# Patient Record
Sex: Male | Born: 1940 | Race: Black or African American | Hispanic: No | Marital: Married | Smoking: Current every day smoker
Health system: Southern US, Community
[De-identification: ages and names within clinical notes are randomized; demographics above are authoritative.]

## PROBLEM LIST (undated history)

## (undated) DIAGNOSIS — Z860100 Personal history of colon polyps, unspecified: Secondary | ICD-10-CM

## (undated) DIAGNOSIS — D696 Thrombocytopenia, unspecified: Secondary | ICD-10-CM

## (undated) DIAGNOSIS — M549 Dorsalgia, unspecified: Secondary | ICD-10-CM

## (undated) DIAGNOSIS — I471 Supraventricular tachycardia, unspecified: Secondary | ICD-10-CM

## (undated) DIAGNOSIS — I4729 Other ventricular tachycardia: Secondary | ICD-10-CM

## (undated) DIAGNOSIS — N183 Chronic kidney disease, stage 3 unspecified: Secondary | ICD-10-CM

## (undated) DIAGNOSIS — I452 Bifascicular block: Secondary | ICD-10-CM

## (undated) DIAGNOSIS — E782 Mixed hyperlipidemia: Secondary | ICD-10-CM

## (undated) DIAGNOSIS — I5189 Other ill-defined heart diseases: Secondary | ICD-10-CM

## (undated) DIAGNOSIS — R001 Bradycardia, unspecified: Secondary | ICD-10-CM

## (undated) DIAGNOSIS — L989 Disorder of the skin and subcutaneous tissue, unspecified: Secondary | ICD-10-CM

## (undated) DIAGNOSIS — Z8619 Personal history of other infectious and parasitic diseases: Secondary | ICD-10-CM

## (undated) DIAGNOSIS — I272 Pulmonary hypertension, unspecified: Secondary | ICD-10-CM

## (undated) DIAGNOSIS — M1990 Unspecified osteoarthritis, unspecified site: Secondary | ICD-10-CM

## (undated) HISTORY — DX: Other ill-defined heart diseases: I51.89

## (undated) HISTORY — DX: Personal history of colon polyps, unspecified: Z86.0100

## (undated) HISTORY — DX: Chronic kidney disease, stage 3 unspecified: N18.30

## (undated) HISTORY — DX: Other ventricular tachycardia: I47.29

## (undated) HISTORY — DX: Bradycardia, unspecified: R00.1

## (undated) HISTORY — DX: Unspecified osteoarthritis, unspecified site: M19.90

## (undated) HISTORY — DX: Disorder of the skin and subcutaneous tissue, unspecified: L98.9

## (undated) HISTORY — DX: Personal history of other infectious and parasitic diseases: Z86.19

## (undated) HISTORY — PX: COLONOSCOPY: SHX174

## (undated) HISTORY — DX: Supraventricular tachycardia, unspecified: I47.10

## (undated) HISTORY — DX: Mixed hyperlipidemia: E78.2

## (undated) HISTORY — DX: Bifascicular block: I45.2

## (undated) HISTORY — DX: Personal history of colonic polyps: Z86.010

## (undated) HISTORY — PX: HEMORRHOID SURGERY: SHX153

## (undated) HISTORY — PX: HERNIA REPAIR: SHX51

## (undated) HISTORY — DX: Pulmonary hypertension, unspecified: I27.20

---

## 2005-11-27 ENCOUNTER — Ambulatory Visit: Payer: Self-pay

## 2007-02-22 ENCOUNTER — Ambulatory Visit: Payer: Self-pay | Admitting: Podiatry

## 2007-09-27 ENCOUNTER — Ambulatory Visit: Payer: Self-pay | Admitting: Unknown Physician Specialty

## 2009-05-24 ENCOUNTER — Ambulatory Visit: Payer: Self-pay | Admitting: Internal Medicine

## 2011-10-30 LAB — CBC AND DIFFERENTIAL
HCT: 42 % (ref 41–53)
Platelets: 133 10*3/uL — AB (ref 150–399)

## 2011-11-16 ENCOUNTER — Ambulatory Visit: Payer: Self-pay | Admitting: Internal Medicine

## 2012-08-05 ENCOUNTER — Ambulatory Visit (INDEPENDENT_AMBULATORY_CARE_PROVIDER_SITE_OTHER): Payer: Medicare Other | Admitting: Internal Medicine

## 2012-08-05 ENCOUNTER — Encounter: Payer: Self-pay | Admitting: *Deleted

## 2012-08-05 ENCOUNTER — Encounter: Payer: Self-pay | Admitting: Internal Medicine

## 2012-08-05 VITALS — BP 116/72 | HR 65 | Temp 97.5°F | Ht 74.0 in | Wt 187.0 lb

## 2012-08-05 DIAGNOSIS — Z23 Encounter for immunization: Secondary | ICD-10-CM

## 2012-08-05 DIAGNOSIS — Z139 Encounter for screening, unspecified: Secondary | ICD-10-CM

## 2012-08-05 DIAGNOSIS — E559 Vitamin D deficiency, unspecified: Secondary | ICD-10-CM

## 2012-08-05 DIAGNOSIS — R5381 Other malaise: Secondary | ICD-10-CM

## 2012-08-05 DIAGNOSIS — D696 Thrombocytopenia, unspecified: Secondary | ICD-10-CM

## 2012-08-05 DIAGNOSIS — R5383 Other fatigue: Secondary | ICD-10-CM

## 2012-08-05 MED ORDER — PNEUMOCOCCAL VAC POLYVALENT 25 MCG/0.5ML IJ INJ
0.5000 mL | INJECTION | Freq: Once | INTRAMUSCULAR | Status: AC
  Administered 2012-08-05: 0.5 mL via INTRAMUSCULAR

## 2012-08-06 ENCOUNTER — Encounter: Payer: Self-pay | Admitting: Internal Medicine
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
## 2012-08-06 NOTE — Assessment & Plan Note (Signed)
Continue supplements.  Check vit D level.

## 2012-08-06 NOTE — Assessment & Plan Note (Signed)
Check cbc 

## 2012-11-12 ENCOUNTER — Encounter: Payer: Self-pay | Admitting: Internal Medicine

## 2012-11-12 ENCOUNTER — Ambulatory Visit (INDEPENDENT_AMBULATORY_CARE_PROVIDER_SITE_OTHER): Payer: Medicare Other | Admitting: Internal Medicine

## 2012-11-12 VITALS — BP 120/80 | HR 51 | Temp 97.7°F | Ht 71.5 in | Wt 172.5 lb

## 2012-11-12 DIAGNOSIS — R5381 Other malaise: Secondary | ICD-10-CM

## 2012-11-12 DIAGNOSIS — Z125 Encounter for screening for malignant neoplasm of prostate: Secondary | ICD-10-CM

## 2012-11-12 DIAGNOSIS — Z1211 Encounter for screening for malignant neoplasm of colon: Secondary | ICD-10-CM

## 2012-11-12 DIAGNOSIS — D696 Thrombocytopenia, unspecified: Secondary | ICD-10-CM

## 2012-11-12 DIAGNOSIS — E559 Vitamin D deficiency, unspecified: Secondary | ICD-10-CM

## 2012-11-12 DIAGNOSIS — R5383 Other fatigue: Secondary | ICD-10-CM

## 2012-11-12 DIAGNOSIS — Z136 Encounter for screening for cardiovascular disorders: Secondary | ICD-10-CM

## 2012-11-12 DIAGNOSIS — Z139 Encounter for screening, unspecified: Secondary | ICD-10-CM

## 2012-11-12 LAB — CBC WITH DIFFERENTIAL/PLATELET
Basophils Absolute: 0 10*3/uL (ref 0.0–0.1)
Eosinophils Absolute: 0.2 10*3/uL (ref 0.0–0.7)
Lymphocytes Relative: 38.9 % (ref 12.0–46.0)
MCHC: 35.3 g/dL (ref 30.0–36.0)
Neutro Abs: 2.5 10*3/uL (ref 1.4–7.7)
Neutrophils Relative %: 49.3 % (ref 43.0–77.0)
RDW: 14.5 % (ref 11.5–14.6)

## 2012-11-12 LAB — COMPREHENSIVE METABOLIC PANEL
ALT: 16 U/L (ref 0–53)
AST: 21 U/L (ref 0–37)
Calcium: 9.5 mg/dL (ref 8.4–10.5)
Chloride: 107 mEq/L (ref 96–112)
Creatinine, Ser: 1.2 mg/dL (ref 0.4–1.5)
Potassium: 4.8 mEq/L (ref 3.5–5.1)

## 2012-11-12 LAB — LIPID PANEL
LDL Cholesterol: 103 mg/dL — ABNORMAL HIGH (ref 0–99)
Total CHOL/HDL Ratio: 5
Triglycerides: 103 mg/dL (ref 0.0–149.0)

## 2012-11-12 NOTE — Assessment & Plan Note (Signed)
Check cbc 
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
## 2012-11-12 NOTE — Assessment & Plan Note (Signed)
Continue supplements.  Check vit D level.

## 2012-11-13 ENCOUNTER — Telehealth: Payer: Self-pay | Admitting: Internal Medicine

## 2012-11-13 ENCOUNTER — Other Ambulatory Visit: Payer: Self-pay | Admitting: Internal Medicine

## 2012-11-13 DIAGNOSIS — E559 Vitamin D deficiency, unspecified: Secondary | ICD-10-CM

## 2012-11-13 DIAGNOSIS — D696 Thrombocytopenia, unspecified: Secondary | ICD-10-CM

## 2012-11-13 MED ORDER — ERGOCALCIFEROL 1.25 MG (50000 UT) PO CAPS: 50000.0000 [IU] | ORAL_CAPSULE | ORAL | Status: DC

## 2012-11-13 NOTE — Telephone Encounter (Signed)
Pt notified of lab results.  Will start ergocalciferol 50,000 units q week. Will recheck vitamin D and platelet count 02/06/13.

## 2012-11-13 NOTE — Progress Notes (Signed)
rx sent in for ergocalciferol.   

## 2012-12-18 ENCOUNTER — Encounter: Payer: Self-pay | Admitting: Internal Medicine

## 2013-01-24 ENCOUNTER — Ambulatory Visit: Payer: Self-pay | Admitting: Unknown Physician Specialty

## 2013-01-31 ENCOUNTER — Encounter: Payer: Self-pay | Admitting: Internal Medicine

## 2013-01-31 DIAGNOSIS — Z860100 Personal history of colon polyps, unspecified: Secondary | ICD-10-CM | POA: Insufficient documentation

## 2013-02-05 ENCOUNTER — Other Ambulatory Visit: Payer: Medicare Other

## 2013-02-05 DIAGNOSIS — D696 Thrombocytopenia, unspecified: Secondary | ICD-10-CM

## 2013-02-05 DIAGNOSIS — E559 Vitamin D deficiency, unspecified: Secondary | ICD-10-CM

## 2013-02-06 ENCOUNTER — Other Ambulatory Visit: Payer: Medicare Other

## 2013-02-06 LAB — PLATELET COUNT: Platelets: 165 10*3/uL (ref 150–400)

## 2013-02-07 ENCOUNTER — Telehealth: Payer: Self-pay | Admitting: *Deleted

## 2013-02-07 ENCOUNTER — Encounter: Payer: Self-pay | Admitting: *Deleted

## 2013-02-07 LAB — VITAMIN D 25 HYDROXY (VIT D DEFICIENCY, FRACTURES): Vit D, 25-Hydroxy: 25 ng/mL — ABNORMAL LOW (ref 30–89)

## 2013-02-07 NOTE — Telephone Encounter (Signed)
Pt notified of labs & verified that he was taking Vitamin D 50,000 units/week. Pt informed to take Vitamin D3 1,000/daily

## 2013-02-16 ENCOUNTER — Encounter: Payer: Self-pay | Admitting: Internal Medicine

## 2013-02-16 DIAGNOSIS — Z86010 Personal history of colonic polyps: Secondary | ICD-10-CM

## 2013-02-20 ENCOUNTER — Encounter: Payer: Self-pay | Admitting: Internal Medicine

## 2013-02-24 ENCOUNTER — Encounter: Payer: Self-pay | Admitting: Internal Medicine

## 2013-05-14 ENCOUNTER — Ambulatory Visit (INDEPENDENT_AMBULATORY_CARE_PROVIDER_SITE_OTHER): Payer: Medicare Other | Admitting: Internal Medicine

## 2013-05-14 ENCOUNTER — Encounter (INDEPENDENT_AMBULATORY_CARE_PROVIDER_SITE_OTHER): Payer: Self-pay

## 2013-05-14 ENCOUNTER — Encounter: Payer: Self-pay | Admitting: Internal Medicine

## 2013-05-14 VITALS — BP 120/90 | HR 56 | Temp 97.6°F | Ht 71.5 in | Wt 181.5 lb

## 2013-05-14 DIAGNOSIS — E559 Vitamin D deficiency, unspecified: Secondary | ICD-10-CM

## 2013-05-14 DIAGNOSIS — D696 Thrombocytopenia, unspecified: Secondary | ICD-10-CM

## 2013-05-14 DIAGNOSIS — Z86010 Personal history of colonic polyps: Secondary | ICD-10-CM

## 2013-05-14 DIAGNOSIS — Z9109 Other allergy status, other than to drugs and biological substances: Secondary | ICD-10-CM

## 2013-05-14 NOTE — Patient Instructions (Signed)
Saline nasal spray - flush nose at least 2-3x/day.  flonase nasal spray - 2 sprays each nostril one time per day (do in the evening).  Mucinex (plain)
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
## 2013-05-18 ENCOUNTER — Encounter: Payer: Self-pay | Admitting: Internal Medicine

## 2013-05-18 DIAGNOSIS — Z9109 Other allergy status, other than to drugs and biological substances: Secondary | ICD-10-CM | POA: Insufficient documentation

## 2013-05-18 NOTE — Assessment & Plan Note (Signed)
Continue supplements.  Follow vit D level.    

## 2013-05-18 NOTE — Assessment & Plan Note (Signed)
Colonoscopy as outlined.  Recommended f/u colonoscopy in 2017.   

## 2013-05-18 NOTE — Assessment & Plan Note (Signed)
Saline nasal flushes and flonase as outlined.  Follow.  Notify me if any worsening symptoms.

## 2013-05-18 NOTE — Assessment & Plan Note (Addendum)
Follow cbc.  Last check 7/14 - wnl.

## 2013-09-30 ENCOUNTER — Encounter (INDEPENDENT_AMBULATORY_CARE_PROVIDER_SITE_OTHER): Payer: Self-pay

## 2013-09-30 ENCOUNTER — Ambulatory Visit (INDEPENDENT_AMBULATORY_CARE_PROVIDER_SITE_OTHER): Payer: Commercial Managed Care - HMO | Admitting: Internal Medicine

## 2013-09-30 ENCOUNTER — Encounter: Payer: Self-pay | Admitting: Internal Medicine

## 2013-09-30 VITALS — BP 130/80 | HR 59 | Temp 98.5°F | Ht 71.5 in | Wt 186.5 lb

## 2013-09-30 DIAGNOSIS — M79604 Pain in right leg: Secondary | ICD-10-CM

## 2013-09-30 DIAGNOSIS — M79609 Pain in unspecified limb: Secondary | ICD-10-CM

## 2013-09-30 DIAGNOSIS — M549 Dorsalgia, unspecified: Secondary | ICD-10-CM

## 2013-09-30 LAB — COMPREHENSIVE METABOLIC PANEL
ALBUMIN: 4.2 g/dL (ref 3.5–5.2)
ALT: 14 U/L (ref 0–53)
AST: 19 U/L (ref 0–37)
Alkaline Phosphatase: 48 U/L (ref 39–117)
BUN: 11 mg/dL (ref 6–23)
CALCIUM: 9.7 mg/dL (ref 8.4–10.5)
CHLORIDE: 105 meq/L (ref 96–112)
CO2: 26 mEq/L (ref 19–32)
Creatinine, Ser: 1.2 mg/dL (ref 0.4–1.5)
GFR: 75.84 mL/min (ref >60.00)
GLUCOSE: 67 mg/dL — AB (ref 70–99)
POTASSIUM: 4.7 meq/L (ref 3.5–5.1)
Sodium: 139 mEq/L (ref 135–145)
Total Bilirubin: 0.7 mg/dL (ref 0.3–1.2)
Total Protein: 6.6 g/dL (ref 6.0–8.3)

## 2013-09-30 LAB — CBC WITH DIFFERENTIAL/PLATELET
BASOS PCT: 0.6 % (ref 0.0–3.0)
Basophils Absolute: 0 10*3/uL (ref 0.0–0.1)
EOS PCT: 3.6 % (ref 0.0–5.0)
Eosinophils Absolute: 0.2 10*3/uL (ref 0.0–0.7)
HCT: 45.2 % (ref 39.0–52.0)
Hemoglobin: 15.2 g/dL (ref 13.0–17.0)
LYMPHS PCT: 39.7 % (ref 12.0–46.0)
Lymphs Abs: 2.3 10*3/uL (ref 0.7–4.0)
MCHC: 33.5 g/dL (ref 30.0–36.0)
MCV: 96.9 fl (ref 78.0–100.0)
MONO ABS: 0.5 10*3/uL (ref 0.1–1.0)
Monocytes Relative: 7.7 % (ref 3.0–12.0)
NEUTROS PCT: 48.4 % (ref 43.0–77.0)
Neutro Abs: 2.9 10*3/uL (ref 1.4–7.7)
Platelets: 142 10*3/uL — ABNORMAL LOW (ref 150.0–400.0)
RBC: 4.67 Mil/uL (ref 4.22–5.81)
RDW: 14.4 % (ref 11.5–14.6)
WBC: 5.9 10*3/uL (ref 4.5–10.5)

## 2013-09-30 NOTE — Progress Notes (Signed)
Pre-visit discussion using our clinic review tool. No additional management support is needed unless otherwise documented below in the visit note.  

## 2013-10-01 ENCOUNTER — Other Ambulatory Visit: Payer: Self-pay | Admitting: Internal Medicine

## 2013-10-01 DIAGNOSIS — D696 Thrombocytopenia, unspecified: Secondary | ICD-10-CM

## 2013-10-01 NOTE — Progress Notes (Signed)
Ordered placed for f/u platelet count.

## 2013-10-05 ENCOUNTER — Encounter: Payer: Self-pay | Admitting: Internal Medicine

## 2013-10-05 DIAGNOSIS — M549 Dorsalgia, unspecified: Secondary | ICD-10-CM | POA: Insufficient documentation
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
## 2013-10-05 NOTE — Assessment & Plan Note (Signed)
Back pain, right hip pain and right leg pain as outlined.  Has had back xray previously.  Arthritis present.  Now with radicular symptoms.  Increased pain with straight leg raise - contralateral leg.  Concern regarding possible disc protrusion/nerve impingement.  Check MRI for further evaluation.

## 2013-10-09 ENCOUNTER — Ambulatory Visit: Payer: Self-pay | Admitting: Internal Medicine

## 2013-10-22 ENCOUNTER — Telehealth: Payer: Self-pay | Admitting: Internal Medicine

## 2013-10-22 ENCOUNTER — Other Ambulatory Visit: Payer: Commercial Managed Care - HMO

## 2013-10-22 ENCOUNTER — Encounter: Payer: Self-pay | Admitting: *Deleted

## 2013-10-22 NOTE — Telephone Encounter (Signed)
Letter was mailed to patient with the following details about his MRI. Your MRI performed on 10/09/13 revealed some arthritis changes & mild narrowing where nerves exits L4-L5. Dr. Nicki Reaper recommends physical therapy if you continue to have persistent back & leg pain. If you would like to proceed with therapy, please contact me at: [phone] ext. [phone] (leave detailed message).

## 2013-10-22 NOTE — Telephone Encounter (Signed)
Pt came in for appt today.  Pt was scheduled for lab only.  Pt did not fast. States he thought he was coming for f/u appt for MRI results.  No appt available.  Please advise for scheduling.

## 2013-10-28 ENCOUNTER — Encounter: Payer: Self-pay | Admitting: Internal Medicine

## 2013-12-16 ENCOUNTER — Encounter: Payer: Self-pay | Admitting: Internal Medicine

## 2013-12-29 ENCOUNTER — Encounter: Payer: Self-pay | Admitting: Adult Health

## 2013-12-29 ENCOUNTER — Ambulatory Visit (INDEPENDENT_AMBULATORY_CARE_PROVIDER_SITE_OTHER): Payer: Commercial Managed Care - HMO | Admitting: Adult Health

## 2013-12-29 VITALS — BP 118/72 | HR 58 | Temp 97.9°F | Resp 14 | Wt 182.2 lb

## 2013-12-29 DIAGNOSIS — R319 Hematuria, unspecified: Secondary | ICD-10-CM

## 2013-12-29 LAB — POCT URINALYSIS DIPSTICK
Bilirubin, UA: NEGATIVE
Blood, UA: NEGATIVE
Glucose, UA: NEGATIVE
Ketones, UA: NEGATIVE
LEUKOCYTES UA: NEGATIVE
Nitrite, UA: NEGATIVE
PROTEIN UA: NEGATIVE
Spec Grav, UA: 1.025
UROBILINOGEN UA: 0.2
pH, UA: 5.5
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
## 2013-12-29 NOTE — Progress Notes (Signed)
Pre visit review using our clinic review tool, if applicable. No additional management support is needed unless otherwise documented below in the visit note. 

## 2013-12-30 ENCOUNTER — Encounter: Payer: Commercial Managed Care - HMO | Admitting: Internal Medicine

## 2014-02-02 ENCOUNTER — Encounter (INDEPENDENT_AMBULATORY_CARE_PROVIDER_SITE_OTHER): Payer: Self-pay

## 2014-02-02 ENCOUNTER — Ambulatory Visit (INDEPENDENT_AMBULATORY_CARE_PROVIDER_SITE_OTHER): Payer: Commercial Managed Care - HMO | Admitting: Internal Medicine

## 2014-02-02 ENCOUNTER — Encounter: Payer: Self-pay | Admitting: Internal Medicine

## 2014-02-02 VITALS — BP 110/72 | HR 55 | Temp 97.6°F | Resp 14 | Ht 72.0 in | Wt 181.2 lb

## 2014-02-02 DIAGNOSIS — Z0000 Encounter for general adult medical examination without abnormal findings: Secondary | ICD-10-CM

## 2014-02-02 DIAGNOSIS — D696 Thrombocytopenia, unspecified: Secondary | ICD-10-CM

## 2014-02-02 DIAGNOSIS — Z9109 Other allergy status, other than to drugs and biological substances: Secondary | ICD-10-CM

## 2014-02-02 DIAGNOSIS — Z125 Encounter for screening for malignant neoplasm of prostate: Secondary | ICD-10-CM

## 2014-02-02 DIAGNOSIS — M545 Low back pain, unspecified: Secondary | ICD-10-CM

## 2014-02-02 DIAGNOSIS — E7800 Pure hypercholesterolemia, unspecified: Secondary | ICD-10-CM

## 2014-02-02 DIAGNOSIS — R319 Hematuria, unspecified: Secondary | ICD-10-CM

## 2014-02-02 DIAGNOSIS — Z86010 Personal history of colonic polyps: Secondary | ICD-10-CM

## 2014-02-02 DIAGNOSIS — E559 Vitamin D deficiency, unspecified: Secondary | ICD-10-CM

## 2014-02-02 NOTE — Progress Notes (Signed)
Pre visit review using our clinic review tool, if applicable. No additional management support is needed unless otherwise documented below in the visit note. 

## 2014-02-07 ENCOUNTER — Encounter: Payer: Self-pay | Admitting: Internal Medicine

## 2014-02-07 DIAGNOSIS — R319 Hematuria, unspecified: Secondary | ICD-10-CM | POA: Insufficient documentation

## 2014-02-07 NOTE — Assessment & Plan Note (Signed)
Colonoscopy as outlined.  Recommended f/u colonoscopy in 2017.

## 2014-02-07 NOTE — Assessment & Plan Note (Signed)
Continue supplements.  Follow vit D level.

## 2014-02-07 NOTE — Assessment & Plan Note (Signed)
Follow cbc.  

## 2014-02-07 NOTE — Assessment & Plan Note (Signed)
Controlled.  Follow.   
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
## 2014-02-07 NOTE — Assessment & Plan Note (Signed)
Back pain as outlined.  Had MRI. Some degenerative changes.  See report for details.  Tylenol as directed.  Discussed further evaluation and w/up.  He declines.  Wants to follow for now.  Will notify me if changes his mind.

## 2014-02-07 NOTE — Assessment & Plan Note (Signed)
Noticed previously for one week.  Saw Raquel.  See her note for details.  Urinalysis here clear.  Will have urology evaluate.

## 2014-02-12 ENCOUNTER — Other Ambulatory Visit (INDEPENDENT_AMBULATORY_CARE_PROVIDER_SITE_OTHER): Payer: Commercial Managed Care - HMO

## 2014-02-12 ENCOUNTER — Other Ambulatory Visit: Payer: Commercial Managed Care - HMO

## 2014-02-12 DIAGNOSIS — Z125 Encounter for screening for malignant neoplasm of prostate: Secondary | ICD-10-CM

## 2014-02-12 DIAGNOSIS — E7800 Pure hypercholesterolemia, unspecified: Secondary | ICD-10-CM

## 2014-02-12 DIAGNOSIS — E559 Vitamin D deficiency, unspecified: Secondary | ICD-10-CM

## 2014-02-12 DIAGNOSIS — D696 Thrombocytopenia, unspecified: Secondary | ICD-10-CM

## 2014-02-12 LAB — CBC WITH DIFFERENTIAL/PLATELET
Basophils Absolute: 0 10*3/uL (ref 0.0–0.1)
Basophils Relative: 0.6 % (ref 0.0–3.0)
Eosinophils Absolute: 0.2 10*3/uL (ref 0.0–0.7)
Eosinophils Relative: 4.4 % (ref 0.0–5.0)
HCT: 46.3 % (ref 39.0–52.0)
Hemoglobin: 15.5 g/dL (ref 13.0–17.0)
Lymphocytes Relative: 43.2 % (ref 12.0–46.0)
Lymphs Abs: 2 10*3/uL (ref 0.7–4.0)
MCHC: 33.4 g/dL (ref 30.0–36.0)
MCV: 96.7 fl (ref 78.0–100.0)
Monocytes Absolute: 0.4 10*3/uL (ref 0.1–1.0)
Monocytes Relative: 7.9 % (ref 3.0–12.0)
Neutro Abs: 2 10*3/uL (ref 1.4–7.7)
Neutrophils Relative %: 43.9 % (ref 43.0–77.0)
Platelets: 154 10*3/uL (ref 150.0–400.0)
RBC: 4.79 Mil/uL (ref 4.22–5.81)
RDW: 14.2 % (ref 11.5–15.5)
WBC: 4.6 10*3/uL (ref 4.0–10.5)

## 2014-02-12 LAB — COMPREHENSIVE METABOLIC PANEL
ALK PHOS: 48 U/L (ref 39–117)
ALT: 16 U/L (ref 0–53)
AST: 19 U/L (ref 0–37)
Albumin: 4.1 g/dL (ref 3.5–5.2)
BILIRUBIN TOTAL: 0.7 mg/dL (ref 0.2–1.2)
BUN: 12 mg/dL (ref 6–23)
CO2: 28 mEq/L (ref 19–32)
Calcium: 9.8 mg/dL (ref 8.4–10.5)
Chloride: 108 mEq/L (ref 96–112)
Creatinine, Ser: 1.3 mg/dL (ref 0.4–1.5)
GFR: 69.74 mL/min (ref >60.00)
GLUCOSE: 100 mg/dL — AB (ref 70–99)
Potassium: 4.7 mEq/L (ref 3.5–5.1)
SODIUM: 142 meq/L (ref 135–145)
Total Protein: 6.8 g/dL (ref 6.0–8.3)

## 2014-02-12 LAB — PLATELET COUNT: Platelets: 167 10*3/uL (ref 150–400)

## 2014-02-12 LAB — LIPID PANEL
Cholesterol: 168 mg/dL (ref 0–200)
HDL: 32.4 mg/dL — ABNORMAL LOW (ref >39.00)
LDL Cholesterol: 110 mg/dL — ABNORMAL HIGH (ref 0–99)
NonHDL: 135.6
Total CHOL/HDL Ratio: 5
Triglycerides: 126 mg/dL (ref 0.0–149.0)
VLDL: 25.2 mg/dL (ref 0.0–40.0)

## 2014-02-12 LAB — PSA, MEDICARE: PSA: 0.72 ng/ml (ref 0.10–4.00)

## 2014-02-12 LAB — TSH: TSH: 1.24 u[IU]/mL (ref 0.35–4.50)

## 2014-02-12 LAB — VITAMIN D 25 HYDROXY (VIT D DEFICIENCY, FRACTURES): VITD: 22.43 ng/mL — ABNORMAL LOW (ref 30.00–100.00)

## 2014-03-04 ENCOUNTER — Ambulatory Visit: Payer: Self-pay | Admitting: Urology

## 2014-04-17 ENCOUNTER — Encounter: Payer: Self-pay | Admitting: Internal Medicine

## 2014-04-17 ENCOUNTER — Ambulatory Visit (INDEPENDENT_AMBULATORY_CARE_PROVIDER_SITE_OTHER): Payer: Commercial Managed Care - HMO | Admitting: Internal Medicine

## 2014-04-17 VITALS — BP 130/80 | HR 76 | Temp 97.7°F | Ht 72.0 in | Wt 180.8 lb

## 2014-04-17 DIAGNOSIS — Z91048 Other nonmedicinal substance allergy status: Secondary | ICD-10-CM

## 2014-04-17 DIAGNOSIS — E559 Vitamin D deficiency, unspecified: Secondary | ICD-10-CM

## 2014-04-17 DIAGNOSIS — D696 Thrombocytopenia, unspecified: Secondary | ICD-10-CM

## 2014-04-17 DIAGNOSIS — R319 Hematuria, unspecified: Secondary | ICD-10-CM

## 2014-04-17 DIAGNOSIS — M545 Low back pain, unspecified: Secondary | ICD-10-CM

## 2014-04-17 DIAGNOSIS — Z86010 Personal history of colonic polyps: Secondary | ICD-10-CM

## 2014-04-17 DIAGNOSIS — Z9109 Other allergy status, other than to drugs and biological substances: Secondary | ICD-10-CM

## 2014-04-17 DIAGNOSIS — Z23 Encounter for immunization: Secondary | ICD-10-CM

## 2014-04-17 NOTE — Progress Notes (Signed)
Pre visit review using our clinic review tool, if applicable. No additional management support is needed unless otherwise documented below in the visit note. 

## 2014-04-19 ENCOUNTER — Encounter: Payer: Self-pay | Admitting: Internal Medicine

## 2014-04-19 NOTE — Assessment & Plan Note (Signed)
Controlled.  Follow.   

## 2014-04-19 NOTE — Assessment & Plan Note (Signed)
Worked up by urology.  Had negative cystoscopy.  Saw Dr Elnoria Howard.  No further episodes.  Follow.

## 2014-04-19 NOTE — Assessment & Plan Note (Signed)
Follow cbc.  Platelet count 02/12/14 - 154 (wnl).

## 2014-04-19 NOTE — Assessment & Plan Note (Signed)
Back pain as outlined.  Flares intermittently.   Had MRI. Some degenerative changes.  See report for details.  Tylenol as directed.  Discussed further evaluation and w/up.  He declines.  Wants to follow for now.  Will notify me if changes his mind.

## 2014-04-19 NOTE — Assessment & Plan Note (Signed)
Colonoscopy as outlined.  Recommended f/u colonoscopy in 2017.
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
## 2014-04-19 NOTE — Assessment & Plan Note (Signed)
Continue supplements.  Follow vit D level.

## 2014-08-18 ENCOUNTER — Ambulatory Visit: Payer: Commercial Managed Care - HMO | Admitting: Internal Medicine

## 2014-09-02 ENCOUNTER — Ambulatory Visit: Payer: Commercial Managed Care - HMO | Admitting: Internal Medicine

## 2014-09-03 ENCOUNTER — Encounter: Payer: Self-pay | Admitting: *Deleted

## 2014-09-03 ENCOUNTER — Ambulatory Visit (INDEPENDENT_AMBULATORY_CARE_PROVIDER_SITE_OTHER): Payer: Commercial Managed Care - HMO | Admitting: Internal Medicine

## 2014-09-03 ENCOUNTER — Encounter: Payer: Self-pay | Admitting: Internal Medicine

## 2014-09-03 VITALS — BP 150/96 | HR 50 | Temp 97.5°F | Ht 72.0 in | Wt 186.1 lb

## 2014-09-03 DIAGNOSIS — E559 Vitamin D deficiency, unspecified: Secondary | ICD-10-CM

## 2014-09-03 DIAGNOSIS — E7800 Pure hypercholesterolemia, unspecified: Secondary | ICD-10-CM

## 2014-09-03 DIAGNOSIS — IMO0001 Reserved for inherently not codable concepts without codable children: Secondary | ICD-10-CM

## 2014-09-03 DIAGNOSIS — R030 Elevated blood-pressure reading, without diagnosis of hypertension: Secondary | ICD-10-CM | POA: Insufficient documentation

## 2014-09-03 DIAGNOSIS — R319 Hematuria, unspecified: Secondary | ICD-10-CM

## 2014-09-03 DIAGNOSIS — D696 Thrombocytopenia, unspecified: Secondary | ICD-10-CM

## 2014-09-03 DIAGNOSIS — M545 Low back pain, unspecified: Secondary | ICD-10-CM

## 2014-09-03 DIAGNOSIS — Z0000 Encounter for general adult medical examination without abnormal findings: Secondary | ICD-10-CM

## 2014-09-03 DIAGNOSIS — Z86010 Personal history of colonic polyps: Secondary | ICD-10-CM

## 2014-09-03 DIAGNOSIS — Z720 Tobacco use: Secondary | ICD-10-CM

## 2014-09-03 LAB — COMPREHENSIVE METABOLIC PANEL
ALT: 11 U/L (ref 0–53)
AST: 17 U/L (ref 0–37)
Albumin: 4.1 g/dL (ref 3.5–5.2)
Alkaline Phosphatase: 51 U/L (ref 39–117)
BUN: 10 mg/dL (ref 6–23)
CHLORIDE: 105 meq/L (ref 96–112)
CO2: 31 mEq/L (ref 19–32)
CREATININE: 1.26 mg/dL (ref 0.40–1.50)
Calcium: 9.5 mg/dL (ref 8.4–10.5)
GFR: 72.19 mL/min (ref >60.00)
GLUCOSE: 78 mg/dL (ref 70–99)
Potassium: 4.6 mEq/L (ref 3.5–5.1)
SODIUM: 138 meq/L (ref 135–145)
TOTAL PROTEIN: 7 g/dL (ref 6.0–8.3)
Total Bilirubin: 0.5 mg/dL (ref 0.2–1.2)

## 2014-09-03 LAB — URINALYSIS, ROUTINE W REFLEX MICROSCOPIC
Bilirubin Urine: NEGATIVE
HGB URINE DIPSTICK: NEGATIVE
Ketones, ur: NEGATIVE
LEUKOCYTES UA: NEGATIVE
Nitrite: NEGATIVE
RBC / HPF: NONE SEEN (ref 0–?)
Specific Gravity, Urine: <=1.005 — AB (ref 1.000–1.030)
Total Protein, Urine: NEGATIVE
Urine Glucose: NEGATIVE
Urobilinogen, UA: 0.2 (ref 0.0–1.0)
pH: 6.5 (ref 5.0–8.0)

## 2014-09-03 LAB — LIPID PANEL
CHOL/HDL RATIO: 5
Cholesterol: 155 mg/dL (ref 0–200)
HDL: 34.3 mg/dL — ABNORMAL LOW (ref >39.00)
LDL CALC: 94 mg/dL (ref 0–99)
NONHDL: 120.7
TRIGLYCERIDES: 135 mg/dL (ref 0.0–149.0)
VLDL: 27 mg/dL (ref 0.0–40.0)

## 2014-09-03 NOTE — Assessment & Plan Note (Signed)
Physical 02/02/14.  Colonoscopy 01/24/13.  Recommended f/u in 2017.  PSP 02/12/14- .72.

## 2014-09-03 NOTE — Assessment & Plan Note (Signed)
Stable.  No significant problems now.
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
## 2014-09-03 NOTE — Assessment & Plan Note (Signed)
Last platelet count wnl.  Follow.  

## 2014-09-03 NOTE — Assessment & Plan Note (Signed)
Discussed the need to quit smoking.  Discussed screening CT.  Will let me know if agreeable.

## 2014-09-03 NOTE — Assessment & Plan Note (Signed)
Last vitamin D level low.  Continue vitamin D supplements.  Follow.

## 2014-09-03 NOTE — Assessment & Plan Note (Signed)
Colonoscopy as outlined 01/24/13.  Recommend f/u colonoscopy in 2017.

## 2014-09-03 NOTE — Assessment & Plan Note (Signed)
Check urinalysis to confirm no red blood cells.

## 2014-09-03 NOTE — Assessment & Plan Note (Signed)
Blood pressure as outlined.  Elevated today.  Has had 5 cups of coffee this am.  Try to cut back on the caffeine.  Have him spot check his pressure.  Get him back in soon to reassess.

## 2014-09-03 NOTE — Progress Notes (Signed)
Pre visit review using our clinic review tool, if applicable. No additional management support is needed unless otherwise documented below in the visit note. 

## 2014-09-24 ENCOUNTER — Ambulatory Visit (INDEPENDENT_AMBULATORY_CARE_PROVIDER_SITE_OTHER): Payer: Commercial Managed Care - HMO | Admitting: Internal Medicine

## 2014-09-24 ENCOUNTER — Encounter: Payer: Self-pay | Admitting: Internal Medicine

## 2014-09-24 VITALS — BP 110/80 | HR 55 | Temp 97.9°F | Ht 72.0 in | Wt 183.4 lb

## 2014-09-24 DIAGNOSIS — M545 Low back pain, unspecified: Secondary | ICD-10-CM

## 2014-09-24 DIAGNOSIS — R030 Elevated blood-pressure reading, without diagnosis of hypertension: Secondary | ICD-10-CM

## 2014-09-24 DIAGNOSIS — Z720 Tobacco use: Secondary | ICD-10-CM

## 2014-09-24 DIAGNOSIS — IMO0001 Reserved for inherently not codable concepts without codable children: Secondary | ICD-10-CM

## 2014-09-24 DIAGNOSIS — Z0000 Encounter for general adult medical examination without abnormal findings: Secondary | ICD-10-CM

## 2014-09-24 MED ORDER — ERGOCALCIFEROL 1.25 MG (50000 UT) PO CAPS: 50000.0000 [IU] | ORAL_CAPSULE | ORAL | Status: DC
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
## 2014-09-24 NOTE — Progress Notes (Signed)
Pre visit review using our clinic review tool, if applicable. No additional management support is needed unless otherwise documented below in the visit note. 

## 2014-10-03 ENCOUNTER — Encounter: Payer: Self-pay | Admitting: Internal Medicine

## 2014-10-03 NOTE — Assessment & Plan Note (Signed)
Physical 01/2014.  Colonoscopy 01/24/13.  Recommended f/u in 2017.  PSA 02/12/14 - .72.

## 2014-10-03 NOTE — Assessment & Plan Note (Signed)
Blood pressure appears to be doing better.  Follow.  Check metabolic panel.  On no medication.

## 2014-10-03 NOTE — Assessment & Plan Note (Signed)
Stable.  Desires no further intervention.  Follow.   

## 2014-10-03 NOTE — Assessment & Plan Note (Signed)
Discussed the need to quit.  Desires not to quit.  Follow.

## 2015-08-11 ENCOUNTER — Telehealth: Payer: Self-pay | Admitting: Internal Medicine

## 2015-08-11 NOTE — Telephone Encounter (Signed)
Left msg to call office to schedule AWV/msn °

## 2015-08-13 ENCOUNTER — Ambulatory Visit (INDEPENDENT_AMBULATORY_CARE_PROVIDER_SITE_OTHER): Payer: Commercial Managed Care - HMO

## 2015-08-13 VITALS — BP 126/76 | HR 56 | Temp 98.1°F | Resp 14 | Ht 72.0 in | Wt 181.8 lb

## 2015-08-13 DIAGNOSIS — Z0000 Encounter for general adult medical examination without abnormal findings: Secondary | ICD-10-CM | POA: Diagnosis not present

## 2015-08-13 DIAGNOSIS — Z23 Encounter for immunization: Secondary | ICD-10-CM | POA: Diagnosis not present

## 2015-08-13 NOTE — Patient Instructions (Signed)
Michael Wiley,  Thank you for taking time to come for your Medicare Wellness Visit.  I appreciate your ongoing commitment to your health goals. Please review the following plan we discussed and let me know if I can assist you in the future.
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
## 2015-08-15 ENCOUNTER — Other Ambulatory Visit: Payer: Self-pay | Admitting: Internal Medicine

## 2015-08-15 DIAGNOSIS — Z13220 Encounter for screening for lipoid disorders: Secondary | ICD-10-CM

## 2015-08-15 DIAGNOSIS — Z125 Encounter for screening for malignant neoplasm of prostate: Secondary | ICD-10-CM

## 2015-08-15 DIAGNOSIS — E559 Vitamin D deficiency, unspecified: Secondary | ICD-10-CM

## 2015-08-15 DIAGNOSIS — D696 Thrombocytopenia, unspecified: Secondary | ICD-10-CM

## 2015-08-15 NOTE — Progress Notes (Signed)
Order placed for f/u labs.  

## 2015-11-16 ENCOUNTER — Other Ambulatory Visit (INDEPENDENT_AMBULATORY_CARE_PROVIDER_SITE_OTHER): Payer: Commercial Managed Care - HMO

## 2015-11-16 DIAGNOSIS — Z125 Encounter for screening for malignant neoplasm of prostate: Secondary | ICD-10-CM

## 2015-11-16 DIAGNOSIS — D696 Thrombocytopenia, unspecified: Secondary | ICD-10-CM

## 2015-11-16 DIAGNOSIS — Z13220 Encounter for screening for lipoid disorders: Secondary | ICD-10-CM

## 2015-11-16 DIAGNOSIS — E559 Vitamin D deficiency, unspecified: Secondary | ICD-10-CM | POA: Diagnosis not present

## 2015-11-16 LAB — CBC WITH DIFFERENTIAL/PLATELET
BASOS ABS: 0 10*3/uL (ref 0.0–0.1)
BASOS PCT: 1 % (ref 0.0–3.0)
Eosinophils Absolute: 0.2 10*3/uL (ref 0.0–0.7)
Eosinophils Relative: 3.4 % (ref 0.0–5.0)
HEMATOCRIT: 43.6 % (ref 39.0–52.0)
HEMOGLOBIN: 14.7 g/dL (ref 13.0–17.0)
Lymphocytes Relative: 37.2 % (ref 12.0–46.0)
Lymphs Abs: 1.8 10*3/uL (ref 0.7–4.0)
MCHC: 33.8 g/dL (ref 30.0–36.0)
MCV: 95.4 fl (ref 78.0–100.0)
Monocytes Absolute: 0.3 10*3/uL (ref 0.1–1.0)
Monocytes Relative: 6.7 % (ref 3.0–12.0)
Neutro Abs: 2.5 10*3/uL (ref 1.4–7.7)
Neutrophils Relative %: 51.7 % (ref 43.0–77.0)
PLATELETS: 145 10*3/uL — AB (ref 150.0–400.0)
RBC: 4.57 Mil/uL (ref 4.22–5.81)
RDW: 14.2 % (ref 11.5–15.5)
WBC: 4.9 10*3/uL (ref 4.0–10.5)

## 2015-11-16 LAB — COMPREHENSIVE METABOLIC PANEL
ALK PHOS: 45 U/L (ref 39–117)
ALT: 8 U/L (ref 0–53)
AST: 13 U/L (ref 0–37)
Albumin: 4.1 g/dL (ref 3.5–5.2)
BILIRUBIN TOTAL: 0.6 mg/dL (ref 0.2–1.2)
BUN: 8 mg/dL (ref 6–23)
CO2: 28 mEq/L (ref 19–32)
Calcium: 9.5 mg/dL (ref 8.4–10.5)
Chloride: 107 mEq/L (ref 96–112)
Creatinine, Ser: 1.28 mg/dL (ref 0.40–1.50)
GFR: 70.66 mL/min (ref >60.00)
GLUCOSE: 94 mg/dL (ref 70–99)
Potassium: 4.2 mEq/L (ref 3.5–5.1)
SODIUM: 142 meq/L (ref 135–145)
TOTAL PROTEIN: 6.4 g/dL (ref 6.0–8.3)

## 2015-11-16 LAB — LIPID PANEL
CHOL/HDL RATIO: 5
Cholesterol: 154 mg/dL (ref 0–200)
HDL: 34.2 mg/dL — ABNORMAL LOW (ref >39.00)
LDL Cholesterol: 95 mg/dL (ref 0–99)
NONHDL: 119.97
Triglycerides: 125 mg/dL (ref 0.0–149.0)
VLDL: 25 mg/dL (ref 0.0–40.0)

## 2015-11-16 LAB — PSA, MEDICARE: PSA: 0.91 ng/ml (ref 0.10–4.00)

## 2015-11-16 LAB — TSH: TSH: 1.03 u[IU]/mL (ref 0.35–4.50)

## 2015-11-16 LAB — VITAMIN D 25 HYDROXY (VIT D DEFICIENCY, FRACTURES): VITD: 14.4 ng/mL — AB (ref 30.00–100.00)

## 2015-11-24 ENCOUNTER — Encounter: Payer: Self-pay | Admitting: Internal Medicine

## 2015-11-24 ENCOUNTER — Ambulatory Visit (INDEPENDENT_AMBULATORY_CARE_PROVIDER_SITE_OTHER): Payer: Commercial Managed Care - HMO | Admitting: Internal Medicine

## 2015-11-24 VITALS — BP 100/70 | HR 55 | Temp 97.8°F | Resp 18 | Ht 71.75 in | Wt 182.0 lb

## 2015-11-24 DIAGNOSIS — Z720 Tobacco use: Secondary | ICD-10-CM | POA: Diagnosis not present

## 2015-11-24 DIAGNOSIS — Z0000 Encounter for general adult medical examination without abnormal findings: Secondary | ICD-10-CM | POA: Diagnosis not present

## 2015-11-24 DIAGNOSIS — D696 Thrombocytopenia, unspecified: Secondary | ICD-10-CM | POA: Diagnosis not present

## 2015-11-24 DIAGNOSIS — Z86010 Personal history of colonic polyps: Secondary | ICD-10-CM

## 2015-11-24 DIAGNOSIS — E559 Vitamin D deficiency, unspecified: Secondary | ICD-10-CM | POA: Diagnosis not present

## 2015-11-24 MED ORDER — ERGOCALCIFEROL 1.25 MG (50000 UT) PO CAPS: 50000.0000 [IU] | ORAL_CAPSULE | ORAL | Status: DC

## 2015-11-24 NOTE — Assessment & Plan Note (Signed)
Physical today 11/24/15.  Colonoscopy 01/24/13.  Recommended f/u in 2017.  PSA 11/16/15 - .91.
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
## 2015-11-24 NOTE — Progress Notes (Signed)
Pre-visit discussion using our clinic review tool. No additional management support is needed unless otherwise documented below in the visit note.  

## 2015-11-24 NOTE — Patient Instructions (Signed)
Take the prescription vitamin D for 12 weeks.  I sent in a prescription.  After completing the 12 weeks, then start vitamin D3 2000 units per day.

## 2015-11-29 ENCOUNTER — Encounter: Payer: Self-pay | Admitting: Internal Medicine

## 2015-11-29 NOTE — Assessment & Plan Note (Signed)
Recent platelet count slightly decreased.  Recheck in the next few weeks.

## 2015-11-29 NOTE — Assessment & Plan Note (Signed)
Colonoscopy 01/24/13 as outlined.  Recommended f/u in 2017.  Followed by GI.

## 2015-11-29 NOTE — Assessment & Plan Note (Signed)
Discussed the need to quit.  Discussed screening CT chest.  He declines both.

## 2015-11-29 NOTE — Assessment & Plan Note (Signed)
Vitamin D level low.  Start vitamin D supplements as directed.

## 2015-12-23 ENCOUNTER — Other Ambulatory Visit (INDEPENDENT_AMBULATORY_CARE_PROVIDER_SITE_OTHER): Payer: Commercial Managed Care - HMO

## 2015-12-23 DIAGNOSIS — D696 Thrombocytopenia, unspecified: Secondary | ICD-10-CM | POA: Diagnosis not present

## 2015-12-23 LAB — PLATELET COUNT: Platelets: 163 10*3/uL (ref 140–400)

## 2016-01-30 NOTE — H&P (Signed)
See scanned note.

## 2016-01-31 ENCOUNTER — Encounter
Admission: RE | Disposition: A | Discharge status: Home / Self Care | Payer: Self-pay | Source: Ambulatory Visit | Attending: Ophthalmology

## 2016-01-31 ENCOUNTER — Encounter: Payer: Self-pay | Admitting: *Deleted

## 2016-01-31 ENCOUNTER — Ambulatory Visit: Payer: Commercial Managed Care - HMO | Admitting: Certified Registered Nurse Anesthetist

## 2016-01-31 ENCOUNTER — Ambulatory Visit
Admission: RE | Admit: 2016-01-31 | Discharge: 2016-01-31 | Disposition: A | Discharge status: Home / Self Care | Payer: Commercial Managed Care - HMO | Source: Ambulatory Visit | Attending: Ophthalmology | Admitting: Ophthalmology

## 2016-01-31 DIAGNOSIS — F17200 Nicotine dependence, unspecified, uncomplicated: Secondary | ICD-10-CM | POA: Diagnosis not present

## 2016-01-31 DIAGNOSIS — H2511 Age-related nuclear cataract, right eye: Secondary | ICD-10-CM | POA: Insufficient documentation

## 2016-01-31 HISTORY — PX: CATARACT EXTRACTION W/PHACO: SHX586

## 2016-01-31 SURGERY — PHACOEMULSIFICATION, CATARACT, WITH IOL INSERTION
Anesthesia: Monitor Anesthesia Care | Site: Eye | Laterality: Right | Wound class: Clean

## 2016-01-31 MED ORDER — ALFENTANIL 500 MCG/ML IJ INJ
INJECTION | INTRAMUSCULAR | Status: DC | PRN
  Administered 2016-01-31: 300 ug via INTRAVENOUS

## 2016-01-31 MED ORDER — CYCLOPENTOLATE HCL 2 % OP SOLN
OPHTHALMIC | Status: AC
  Filled 2016-01-31: qty 2

## 2016-01-31 MED ORDER — POVIDONE-IODINE 5 % OP SOLN
OPHTHALMIC | Status: AC
  Filled 2016-01-31: qty 30

## 2016-01-31 MED ORDER — POVIDONE-IODINE 5 % OP SOLN
OPHTHALMIC | Status: DC | PRN
  Administered 2016-01-31: 1 via OPHTHALMIC

## 2016-01-31 MED ORDER — LIDOCAINE HCL (PF) 4 % IJ SOLN
INTRAMUSCULAR | Status: AC
  Filled 2016-01-31: qty 5

## 2016-01-31 MED ORDER — CARBACHOL 0.01 % IO SOLN
INTRAOCULAR | Status: DC | PRN
  Administered 2016-01-31: .5 mL via INTRAOCULAR

## 2016-01-31 MED ORDER — MOXIFLOXACIN HCL 0.5 % OP SOLN
OPHTHALMIC | Status: DC | PRN
  Administered 2016-01-31: 1 [drp] via OPHTHALMIC

## 2016-01-31 MED ORDER — TETRACAINE HCL 0.5 % OP SOLN
OPHTHALMIC | Status: AC
  Filled 2016-01-31: qty 2

## 2016-01-31 MED ORDER — MOXIFLOXACIN HCL 0.5 % OP SOLN
1.0000 [drp] | OPHTHALMIC | Status: DC | PRN
  Administered 2016-01-31: 1 [drp] via OPHTHALMIC

## 2016-01-31 MED ORDER — PHENYLEPHRINE HCL 10 % OP SOLN
OPHTHALMIC | Status: AC
  Filled 2016-01-31: qty 5

## 2016-01-31 MED ORDER — EPINEPHRINE HCL 1 MG/ML IJ SOLN
INTRAOCULAR | Status: DC | PRN
  Administered 2016-01-31: 1 mL via OPHTHALMIC

## 2016-01-31 MED ORDER — TETRACAINE HCL 0.5 % OP SOLN
OPHTHALMIC | Status: DC | PRN
  Administered 2016-01-31: 1 [drp] via OPHTHALMIC

## 2016-01-31 MED ORDER — CEFUROXIME OPHTHALMIC INJECTION 1 MG/0.1 ML
INJECTION | OPHTHALMIC | Status: DC | PRN
  Administered 2016-01-31: .1 mL via INTRACAMERAL

## 2016-01-31 MED ORDER — PHENYLEPHRINE HCL 10 % OP SOLN
1.0000 [drp] | OPHTHALMIC | Status: DC | PRN
  Administered 2016-01-31: 1 [drp] via OPHTHALMIC

## 2016-01-31 MED ORDER — HYALURONIDASE HUMAN 150 UNIT/ML IJ SOLN
INTRAMUSCULAR | Status: AC
  Filled 2016-01-31: qty 1

## 2016-01-31 MED ORDER — NA CHONDROIT SULF-NA HYALURON 40-17 MG/ML IO SOLN
INTRAOCULAR | Status: DC | PRN
  Administered 2016-01-31: 1 mL via INTRAOCULAR

## 2016-01-31 MED ORDER — MIDAZOLAM HCL 2 MG/2ML IJ SOLN
INTRAMUSCULAR | Status: DC | PRN
  Administered 2016-01-31: 1 mg via INTRAVENOUS

## 2016-01-31 MED ORDER — BUPIVACAINE HCL (PF) 0.75 % IJ SOLN
INTRAMUSCULAR | Status: AC
  Filled 2016-01-31: qty 10

## 2016-01-31 MED ORDER — SODIUM CHLORIDE 0.9 % IV SOLN
INTRAVENOUS | Status: DC
  Administered 2016-01-31: 08:00:00 via INTRAVENOUS

## 2016-01-31 MED ORDER — LIDOCAINE HCL (PF) 4 % IJ SOLN
INTRAOCULAR | Status: DC | PRN
  Administered 2016-01-31: .5 mL via OPHTHALMIC

## 2016-01-31 MED ORDER — LIDOCAINE HCL (PF) 4 % IJ SOLN
INTRAMUSCULAR | Status: DC | PRN
  Administered 2016-01-31: 4 mL via OPHTHALMIC

## 2016-01-31 MED ORDER — NA CHONDROIT SULF-NA HYALURON 40-17 MG/ML IO SOLN
INTRAOCULAR | Status: AC
  Filled 2016-01-31: qty 1

## 2016-01-31 MED ORDER — CEFUROXIME OPHTHALMIC INJECTION 1 MG/0.1 ML
INJECTION | OPHTHALMIC | Status: AC
  Filled 2016-01-31: qty 0.1

## 2016-01-31 MED ORDER — EPINEPHRINE HCL 1 MG/ML IJ SOLN
INTRAMUSCULAR | Status: AC
  Filled 2016-01-31: qty 2

## 2016-01-31 MED ORDER — CYCLOPENTOLATE HCL 2 % OP SOLN
1.0000 [drp] | OPHTHALMIC | Status: DC | PRN
  Administered 2016-01-31: 1 [drp] via OPHTHALMIC

## 2016-01-31 MED ORDER — MOXIFLOXACIN HCL 0.5 % OP SOLN
OPHTHALMIC | Status: AC
  Filled 2016-01-31: qty 3

## 2016-01-31 SURGICAL SUPPLY — 30 items
CANNULA ANT/CHMB 27GA (MISCELLANEOUS) ×3 IMPLANT
CORD BIP STRL DISP 12FT (MISCELLANEOUS) ×3 IMPLANT
CUP MEDICINE 2OZ PLAST GRAD ST (MISCELLANEOUS) ×3 IMPLANT
DRAPE XRAY CASSETTE 23X24 (DRAPES) ×3 IMPLANT
ERASER HMR WETFIELD 18G (MISCELLANEOUS) ×3 IMPLANT
GLOVE BIO SURGEON STRL SZ8 (GLOVE) ×3 IMPLANT
GLOVE SURG LX 6.5 MICRO (GLOVE) ×2
GLOVE SURG LX 8.0 MICRO (GLOVE) ×2
GLOVE SURG LX STRL 6.5 MICRO (GLOVE) ×1 IMPLANT
GLOVE SURG LX STRL 8.0 MICRO (GLOVE) ×1 IMPLANT
GOWN STRL REUS W/ TWL LRG LVL3 (GOWN DISPOSABLE) ×1 IMPLANT
GOWN STRL REUS W/ TWL XL LVL3 (GOWN DISPOSABLE) ×1 IMPLANT
GOWN STRL REUS W/TWL LRG LVL3 (GOWN DISPOSABLE) ×2
GOWN STRL REUS W/TWL XL LVL3 (GOWN DISPOSABLE) ×2
LENS IOL ACRSF IQ ULTRA 19.0 (Intraocular Lens) ×1 IMPLANT
LENS IOL ACRYSOF IQ 19.0 (Intraocular Lens) ×3 IMPLANT
PACK CATARACT (MISCELLANEOUS) ×3 IMPLANT
PACK CATARACT DINGLEDEIN LX (MISCELLANEOUS) ×3 IMPLANT
PACK EYE AFTER SURG (MISCELLANEOUS) ×3 IMPLANT
SHLD EYE VISITEC  UNIV (MISCELLANEOUS) ×3 IMPLANT
SOL BSS BAG (MISCELLANEOUS) ×3
SOL PREP PVP 2OZ (MISCELLANEOUS) ×3
SOLUTION BSS BAG (MISCELLANEOUS) ×1 IMPLANT
SOLUTION PREP PVP 2OZ (MISCELLANEOUS) ×1 IMPLANT
SUT SILK 5-0 (SUTURE) ×3 IMPLANT
SYR 3ML LL SCALE MARK (SYRINGE) ×3 IMPLANT
SYR 5ML LL (SYRINGE) ×3 IMPLANT
SYR TB 1ML 27GX1/2 LL (SYRINGE) ×3 IMPLANT
WATER STERILE IRR 1000ML POUR (IV SOLUTION) ×3 IMPLANT
WIPE NON LINTING 3.25X3.25 (MISCELLANEOUS) ×3 IMPLANT

## 2016-01-31 NOTE — Anesthesia Postprocedure Evaluation (Signed)
Anesthesia Post Note  Patient: Michael Wiley  Procedure(s) Performed: Procedure(s) (LRB): CATARACT EXTRACTION PHACO AND INTRAOCULAR LENS PLACEMENT (IOC) (Right)  Patient location during evaluation: PACU Anesthesia Type: MAC Level of consciousness: awake and alert and oriented Vital Signs Assessment: post-procedure vital signs reviewed and stable Respiratory status: respiratory function stable Cardiovascular status: stable Anesthetic complications: no    Last Vitals:  Filed Vitals:   01/31/16 0753  BP: 139/77  Pulse: 47  Temp: 36.5 C  Resp: 16    Last Pain: There were no vitals filed for this visit.               Blima Singer

## 2016-01-31 NOTE — Transfer of Care (Signed)
Immediate Anesthesia Transfer of Care Note  Patient: Michael Wiley  Procedure(s) Performed: Procedure(s) with comments: CATARACT EXTRACTION PHACO AND INTRAOCULAR LENS PLACEMENT (IOC) (Right) - [place] 01:23 AP% 23.0 CDE 35.29 Fluid pack lot # [id number]  Patient Location: PACU  Anesthesia Type:MAC  Level of Consciousness: awake, alert  and oriented  Airway & Oxygen Therapy: Patient Spontanous Breathing  Post-op Assessment: Report given to RN and Post -op Vital signs reviewed and stable  Post vital signs: Reviewed and stable  Last Vitals:  Filed Vitals:   01/31/16 0753  BP: 139/77  Pulse: 47  Temp: 36.5 C  Resp: 16    Last Pain: There were no vitals filed for this visit.       Complications: No apparent anesthesia complications

## 2016-01-31 NOTE — Anesthesia Preprocedure Evaluation (Signed)
Anesthesia Evaluation  Patient identified by MRN, date of birth, ID band  Reviewed: Allergy & Precautions, NPO status , Patient's Chart, lab work & pertinent test results  History of Anesthesia Complications Negative for: history of anesthetic complications  Airway Mallampati: II       Dental   Pulmonary neg pulmonary ROS, Current Smoker,           Cardiovascular negative cardio ROS       Neuro/Psych negative neurological ROS     GI/Hepatic negative GI ROS, Neg liver ROS,   Endo/Other  negative endocrine ROS  Renal/GU negative Renal ROS     Musculoskeletal   Abdominal   Peds  Hematology negative hematology ROS (+)   Anesthesia Other Findings   Reproductive/Obstetrics                             Anesthesia Physical Anesthesia Plan  ASA: II  Anesthesia Plan: MAC   Post-op Pain Management:    Induction: Intravenous  Airway Management Planned: Nasal Cannula  Additional Equipment:   Intra-op Plan:   Post-operative Plan:   Informed Consent: I have reviewed the patients History and Physical, chart, labs and discussed the procedure including the risks, benefits and alternatives for the proposed anesthesia with the patient or authorized representative who has indicated his/her understanding and acceptance.     Plan Discussed with:   Anesthesia Plan Comments:         Anesthesia Quick Evaluation

## 2016-01-31 NOTE — Interval H&P Note (Signed)
History and Physical Interval Note:  01/31/2016 8:54 AM  Michael Wiley  has presented today for surgery, with the diagnosis of cataract  The various methods of treatment have been discussed with the patient and family. After consideration of risks, benefits and other options for treatment, the patient has consented to  Procedure(s): CATARACT EXTRACTION PHACO AND INTRAOCULAR LENS PLACEMENT ([place]) (Right) as a surgical intervention .  The patient's history has been reviewed, patient examined, no change in status, stable for surgery.  I have reviewed the patient's chart and labs.  Questions were answered to the patient's satisfaction.     Weaver Tweed

## 2016-01-31 NOTE — Discharge Instructions (Addendum)
See scanned note. Eye Surgery Discharge Instructions  Expect mild scratchy sensation or mild soreness. DO NOT RUB YOUR EYE!  The day of surgery:  Minimal physical activity, but bed rest is not required  No reading, computer work, or close hand work  No bending, lifting, or straining.  May watch TV  For 24 hours:  No driving, legal decisions, or alcoholic beverages  Safety precautions  Eat anything you prefer: It is better to start with liquids, then soup then solid foods.  _____ Eye patch should be worn until postoperative exam tomorrow.  ____ Solar shield eyeglasses should be worn for comfort in the sunlight/patch while sleeping  Resume all regular medications including aspirin or Coumadin if these were discontinued prior to surgery. You may shower, bathe, shave, or wash your hair. Tylenol may be taken for mild discomfort.  Call your doctor if you experience significant pain, nausea, or vomiting, fever > 101 or other signs of infection. [phone] or [phone] Specific instructions:  Follow-up Information    Follow up with Estill Cotta, MD.   Specialty:  Ophthalmology   Why:  02-01-16 at 9:40   Contact information:   [address]   [place] [address] [phone]

## 2016-01-31 NOTE — Op Note (Signed)
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 Item Serial No. Manufacturer Lot No. LRB No. Used  LENS IOL ACRYSOF IQ 19.0 - [id number] Intraocular Lens LENS IOL ACRYSOF IQ 19.0 [id number] ALCON   Right 1   Anesthesia: 2% Lidocaine and 4% Marcaine in a 50/50 mixture with 10 unites/ml of Hylenex given as a peribulbar Anesthesiologist: Anesthesiologist: Gunnar Fusi, MD CRNA: Demetrius Charity, CRNA Complications: none Estimated Blood Loss: less than 1 ml  Description of procedure:  The patient was given anesthesia and sedation via intravenous access. The patient was then prepped and draped in the usual fashion. A 25-gauge needle was bent for initiating the capsulorhexis. A 5-0 silk suture was placed through the conjunctiva superior and inferiorly to serve as bridle sutures. Hemostasis was obtained at the superior limbus using an eraser cautery. A partial thickness groove was made at the anterior surgical limbus with a 64 Beaver blade and this was dissected anteriorly with an [employer]aya. The anterior chamber was entered at 10 o'clock with a 1.0 mm paracentesis knife and through the lamellar dissection with a 2.6 mm Alcon keratome. Epi-Shugarcaine 0.5 CC [9 cc BSS Plus (Alcon), 3 cc 4% preservative-free lidocaine (Hospira) and 4 cc 1:1000 preservative-free, bisulfite-free epinephrine] was injected into the anterior chamber via the paracentesis tract. Epi-Shugarcaine 0.5 CC [9 cc BSS Plus (Alcon), 3 cc 4% preservative-free lidocaine (Hospira) and 4 cc 1:1000 preservative-free, bisulfite-free epinephrine] was injected into the anterior chamber via the paracentesis tract. DiscoVisc was injected to  replace the aqueous and a continuous tear curvilinear capsulorhexis was performed using a bent 25-gauge needle.  Balance salt on a syringe was used to perform hydro-dissection and phacoemulsification was carried out using a divide and conquer technique. Procedure(s) with comments: CATARACT EXTRACTION PHACO AND INTRAOCULAR LENS PLACEMENT ([facility]) (Right) - [place] 01:23 AP% 23.0 CDE 35.29 Fluid pack lot # [id number]. Irrigation/aspiration was used to remove the residual cortex and the capsular bag was inflated with DiscoVisc. The intraocular lens was inserted into the capsular bag using a pre-loaded Acrysert Delivery System. Irrigation/aspiration was used to remove the residual DiscoVisc. The wound was inflated with balanced salt and checked for leaks. None were found. Miostat was injected via the paracentesis track and 0.1 ml of cefuroxime containing 1 mg of drug  was injected via the paracentesis track. The wound was checked for leaks again and none were found.   The bridal sutures were removed and two drops of Vigamox were placed on the eye. An eye shield was placed to protect the eye and the patient was discharged to the recovery area in good condition.   Latana Colin MD

## 2016-03-30 ENCOUNTER — Encounter: Payer: Self-pay | Admitting: *Deleted

## 2016-05-24 ENCOUNTER — Ambulatory Visit (INDEPENDENT_AMBULATORY_CARE_PROVIDER_SITE_OTHER): Payer: Commercial Managed Care - HMO

## 2016-05-24 DIAGNOSIS — Z23 Encounter for immunization: Secondary | ICD-10-CM | POA: Diagnosis not present

## 2016-05-30 ENCOUNTER — Ambulatory Visit (INDEPENDENT_AMBULATORY_CARE_PROVIDER_SITE_OTHER): Payer: Commercial Managed Care - HMO | Admitting: Internal Medicine

## 2016-05-30 ENCOUNTER — Encounter: Payer: Self-pay | Admitting: Internal Medicine

## 2016-05-30 DIAGNOSIS — M545 Low back pain, unspecified: Secondary | ICD-10-CM

## 2016-05-30 DIAGNOSIS — Z720 Tobacco use: Secondary | ICD-10-CM

## 2016-05-30 DIAGNOSIS — E559 Vitamin D deficiency, unspecified: Secondary | ICD-10-CM | POA: Diagnosis not present

## 2016-05-30 DIAGNOSIS — D696 Thrombocytopenia, unspecified: Secondary | ICD-10-CM

## 2016-05-30 DIAGNOSIS — I8392 Asymptomatic varicose veins of left lower extremity: Secondary | ICD-10-CM

## 2016-05-30 NOTE — Progress Notes (Signed)
Pre visit review using our clinic review tool, if applicable. No additional management support is needed unless otherwise documented below in the visit note. 
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
## 2016-05-31 ENCOUNTER — Encounter: Payer: Self-pay | Admitting: Internal Medicine

## 2016-05-31 DIAGNOSIS — I8392 Asymptomatic varicose veins of left lower extremity: Secondary | ICD-10-CM | POA: Insufficient documentation

## 2016-05-31 NOTE — Assessment & Plan Note (Signed)
Has strong palpable DP pulses bilaterally.  No pain - lower extremities.  No increased erythema.  No swelling.  Stable for years.  Hold on any further intervention.  Follow.

## 2016-05-31 NOTE — Assessment & Plan Note (Signed)
Last platelet count wnl.  Follow.  

## 2016-05-31 NOTE — Assessment & Plan Note (Signed)
Follow vitamin D level.  

## 2016-05-31 NOTE — Assessment & Plan Note (Signed)
Not a significant issue for him.  Follow.

## 2016-05-31 NOTE — Assessment & Plan Note (Signed)
Discussed the need to quit smoking.  Discussed obtaining screening CT scan.  He will think about this.

## 2016-06-15 ENCOUNTER — Encounter: Payer: Self-pay | Admitting: *Deleted

## 2016-06-16 ENCOUNTER — Ambulatory Visit: Payer: Commercial Managed Care - HMO | Admitting: Certified Registered"

## 2016-06-16 ENCOUNTER — Encounter
Admission: RE | Disposition: A | Discharge status: Home / Self Care | Payer: Self-pay | Source: Ambulatory Visit | Attending: Unknown Physician Specialty

## 2016-06-16 ENCOUNTER — Ambulatory Visit
Admission: RE | Admit: 2016-06-16 | Discharge: 2016-06-16 | Disposition: A | Discharge status: Home / Self Care | Payer: Commercial Managed Care - HMO | Source: Ambulatory Visit | Attending: Unknown Physician Specialty | Admitting: Unknown Physician Specialty

## 2016-06-16 DIAGNOSIS — K5730 Diverticulosis of large intestine without perforation or abscess without bleeding: Secondary | ICD-10-CM | POA: Diagnosis not present

## 2016-06-16 DIAGNOSIS — Z1211 Encounter for screening for malignant neoplasm of colon: Secondary | ICD-10-CM | POA: Diagnosis not present

## 2016-06-16 DIAGNOSIS — K621 Rectal polyp: Secondary | ICD-10-CM | POA: Insufficient documentation

## 2016-06-16 DIAGNOSIS — D120 Benign neoplasm of cecum: Secondary | ICD-10-CM | POA: Diagnosis not present

## 2016-06-16 DIAGNOSIS — K640 First degree hemorrhoids: Secondary | ICD-10-CM | POA: Insufficient documentation

## 2016-06-16 DIAGNOSIS — D123 Benign neoplasm of transverse colon: Secondary | ICD-10-CM | POA: Insufficient documentation

## 2016-06-16 DIAGNOSIS — Z86010 Personal history of colonic polyps: Secondary | ICD-10-CM | POA: Diagnosis not present

## 2016-06-16 DIAGNOSIS — Z79899 Other long term (current) drug therapy: Secondary | ICD-10-CM | POA: Insufficient documentation

## 2016-06-16 DIAGNOSIS — D696 Thrombocytopenia, unspecified: Secondary | ICD-10-CM | POA: Insufficient documentation

## 2016-06-16 DIAGNOSIS — D122 Benign neoplasm of ascending colon: Secondary | ICD-10-CM | POA: Diagnosis not present

## 2016-06-16 DIAGNOSIS — F17210 Nicotine dependence, cigarettes, uncomplicated: Secondary | ICD-10-CM | POA: Insufficient documentation

## 2016-06-16 DIAGNOSIS — M1990 Unspecified osteoarthritis, unspecified site: Secondary | ICD-10-CM | POA: Diagnosis not present

## 2016-06-16 HISTORY — DX: Thrombocytopenia, unspecified: D69.6

## 2016-06-16 HISTORY — PX: COLONOSCOPY WITH PROPOFOL: SHX5780

## 2016-06-16 HISTORY — DX: Dorsalgia, unspecified: M54.9

## 2016-06-16 SURGERY — COLONOSCOPY WITH PROPOFOL
Anesthesia: General

## 2016-06-16 MED ORDER — SODIUM CHLORIDE 0.9 % IV SOLN: INTRAVENOUS | Status: DC

## 2016-06-16 MED ORDER — SODIUM CHLORIDE 0.9 % IV SOLN
INTRAVENOUS | Status: DC
  Administered 2016-06-16: 1000 mL via INTRAVENOUS

## 2016-06-16 MED ORDER — PROPOFOL 500 MG/50ML IV EMUL
INTRAVENOUS | Status: DC | PRN
  Administered 2016-06-16: 120 ug/kg/min via INTRAVENOUS

## 2016-06-16 MED ORDER — PROPOFOL 10 MG/ML IV BOLUS
INTRAVENOUS | Status: DC | PRN
  Administered 2016-06-16: 30 mg via INTRAVENOUS
  Administered 2016-06-16: 70 mg via INTRAVENOUS

## 2016-06-16 MED ORDER — MIDAZOLAM HCL 5 MG/5ML IJ SOLN
INTRAMUSCULAR | Status: DC | PRN
  Administered 2016-06-16: 1 mg via INTRAVENOUS

## 2016-06-16 NOTE — Transfer of Care (Signed)
Immediate Anesthesia Transfer of Care Note  Patient: Michael Wiley  Procedure(s) Performed: Procedure(s): COLONOSCOPY WITH PROPOFOL (N/A)  Patient Location: Endoscopy Unit  Anesthesia Type:General  Level of Consciousness: awake and alert   Airway & Oxygen Therapy: Patient Spontanous Breathing and Patient connected to nasal cannula oxygen  Post-op Assessment: Report given to RN and Post -op Vital signs reviewed and stable  Post vital signs: Reviewed  Last Vitals:  Vitals:   06/16/16 1000 06/16/16 1002  BP: 109/84 109/84  Pulse:  (!) 59  Resp:  14  Temp: 36.8 C 36.6 C    Last Pain:  Vitals:   06/16/16 0839  TempSrc: Tympanic         Complications: No apparent anesthesia complications

## 2016-06-16 NOTE — Anesthesia Postprocedure Evaluation (Signed)
Anesthesia Post Note  Patient: Michael Wiley  Procedure(s) Performed: Procedure(s) (LRB): COLONOSCOPY WITH PROPOFOL (N/A)  Patient location during evaluation: Endoscopy Anesthesia Type: General Level of consciousness: awake and alert Pain management: pain level controlled Vital Signs Assessment: post-procedure vital signs reviewed and stable Respiratory status: spontaneous breathing and respiratory function stable Cardiovascular status: stable Anesthetic complications: no    Last Vitals:  Vitals:   06/16/16 1000 06/16/16 1002  BP: 109/84 109/84  Pulse:  (!) 59  Resp:  14  Temp: 36.6 C 36.6 C    Last Pain:  Vitals:   06/16/16 0839  TempSrc: Tympanic                 Fina Heizer K
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
## 2016-06-16 NOTE — Anesthesia Preprocedure Evaluation (Signed)
Anesthesia Evaluation  Patient identified by MRN, date of birth, ID band Patient awake    Reviewed: Allergy & Precautions, NPO status , Patient's Chart, lab work & pertinent test results  History of Anesthesia Complications Negative for: history of anesthetic complications  Airway Mallampati: II       Dental   Pulmonary Current Smoker,           Cardiovascular negative cardio ROS       Neuro/Psych negative neurological ROS     GI/Hepatic negative GI ROS, Neg liver ROS,   Endo/Other  negative endocrine ROS  Renal/GU negative Renal ROS     Musculoskeletal  (+) Arthritis , Osteoarthritis,    Abdominal   Peds  Hematology negative hematology ROS (+)   Anesthesia Other Findings   Reproductive/Obstetrics                             Anesthesia Physical Anesthesia Plan  ASA: II  Anesthesia Plan: General   Post-op Pain Management:    Induction: Intravenous  Airway Management Planned: Nasal Cannula  Additional Equipment:   Intra-op Plan:   Post-operative Plan:   Informed Consent: I have reviewed the patients History and Physical, chart, labs and discussed the procedure including the risks, benefits and alternatives for the proposed anesthesia with the patient or authorized representative who has indicated his/her understanding and acceptance.     Plan Discussed with:   Anesthesia Plan Comments:         Anesthesia Quick Evaluation

## 2016-06-16 NOTE — Progress Notes (Signed)
This note also relates to the following rows which could not be included: Pulse Rate - Cannot attach notes to unvalidated device data ECG Heart Rate - Cannot attach notes to unvalidated device data Resp - Cannot attach notes to unvalidated device data SpO2 - Cannot attach notes to unvalidated device data

## 2016-06-16 NOTE — Op Note (Signed)
[facility] Patient Name: Michael Wiley Procedure Date: 06/16/2016 9:18 AM MRN: [id number] Account #: [id number] Date of Birth: [birth year] Admit Type: Outpatient Age: 75 Room: [facility] ENDO ROOM 4 Gender: Male Note Status: Finalized Procedure:            Colonoscopy Indications:          High risk colon cancer surveillance: Personal history                        of colonic polyps Providers:            Manya Silvas, MD Referring MD:         Einar Pheasant, MD (Referring MD) Medicines:            Propofol per Anesthesia Complications:        No immediate complications. Procedure:            Pre-Anesthesia Assessment:                       - After reviewing the risks and benefits, the patient                        was deemed in satisfactory condition to undergo the                        procedure.                       After obtaining informed consent, the colonoscope was                        passed under direct vision. Throughout the procedure,                        the patient's blood pressure, pulse, and oxygen                        saturations were monitored continuously. The                        Colonoscope was introduced through the anus and                        advanced to the the cecum, identified by appendiceal                        orifice and ileocecal valve. The colonoscopy was                        performed without difficulty. The patient tolerated the                        procedure well. The quality of the bowel preparation                        was good. Findings:      Many sessile polyps were found in the rectum, ascending colon and cecum.       The polyps were diminutive in size. These polyps were removed with a       jumbo cold forceps. Resection and retrieval were complete.  A small polyp was found in the cecum. The polyp was sessile. The polyp       was removed with a cold snare. Resection and retrieval were  complete.      A diminutive polyp was found in the ascending colon. The polyp was       sessile. The polyp was removed with a cold snare. Resection and       retrieval were complete.      A diminutive polyp was found in the transverse colon. The polyp was       sessile. The polyp was removed with a jumbo cold forceps. Resection and       retrieval were complete.      Many small-mouthed diverticula were found in the sigmoid colon.      Internal hemorrhoids were found during endoscopy. The hemorrhoids were       small and Grade I (internal hemorrhoids that do not prolapse).      The exam was otherwise without abnormality. Impression:           - Many diminutive polyps in the rectum, in the                        ascending colon and in the cecum, removed with a jumbo                        cold forceps. Resected and retrieved.                       - One small polyp in the cecum, removed with a cold                        snare. Resected and retrieved.                       - One diminutive polyp in the ascending colon, removed                        with a cold snare. Resected and retrieved.                       - One diminutive polyp in the transverse colon, removed                        with a jumbo cold forceps. Resected and retrieved.                       - Diverticulosis in the sigmoid colon.                       - Internal hemorrhoids.                       - The examination was otherwise normal. Recommendation:       - Await pathology results. Manya Silvas, MD 06/16/2016 10:00:40 AM This report has been signed electronically. Number of Addenda: 0 Note Initiated On: 06/16/2016 9:18 AM Scope Withdrawal Time: 0 hours 25 minutes 23 seconds  Total Procedure Duration: 0 hours 30 minutes 21 seconds       [facility]

## 2016-06-17 NOTE — Progress Notes (Signed)
9:26 am - Spoke with wife.

## 2016-06-19 ENCOUNTER — Encounter: Payer: Self-pay | Admitting: Unknown Physician Specialty

## 2016-06-19 LAB — SURGICAL PATHOLOGY

## 2016-08-14 ENCOUNTER — Ambulatory Visit (INDEPENDENT_AMBULATORY_CARE_PROVIDER_SITE_OTHER): Payer: Commercial Managed Care - HMO

## 2016-08-14 VITALS — BP 110/62 | HR 60 | Temp 98.0°F | Resp 12 | Ht 72.0 in | Wt 184.4 lb

## 2016-08-14 DIAGNOSIS — Z0000 Encounter for general adult medical examination without abnormal findings: Secondary | ICD-10-CM

## 2016-08-14 NOTE — Patient Instructions (Addendum)
  Mr. Gartman , Thank you for taking time to come for your Medicare Wellness Visit. I appreciate your ongoing commitment to your health goals. Please review the following plan we discussed and let me know if I can assist you in the future.   Follow up with Dr. Nicki Reaper as needed.  These are the goals we discussed: Goals    . Healthy Lifestyle          Maintain exercise regimen of staying active with yard work, your job and church.  Continue purposeful walking. Choose lean meats, fruits and vegetables.    . Increase water intake          Stay hydrated!  Drink plenty of fluids.  Substitute coffee/pepsi for water.       This is a list of the screening recommended for you and due dates:  Health Maintenance  Topic Date Due  . Tetanus Vaccine  11/22/1960  . Shingles Vaccine  11/22/2001  . Colon Cancer Screening  06/16/2026  . Flu Shot  Addressed  . Pneumonia vaccines  Completed
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
## 2016-12-05 ENCOUNTER — Encounter: Payer: Self-pay | Admitting: Internal Medicine

## 2016-12-05 ENCOUNTER — Ambulatory Visit (INDEPENDENT_AMBULATORY_CARE_PROVIDER_SITE_OTHER): Payer: Commercial Managed Care - HMO | Admitting: Internal Medicine

## 2016-12-05 VITALS — BP 110/64 | HR 51 | Temp 97.9°F | Resp 12 | Ht 72.0 in | Wt 179.4 lb

## 2016-12-05 DIAGNOSIS — Z13220 Encounter for screening for lipoid disorders: Secondary | ICD-10-CM

## 2016-12-05 DIAGNOSIS — Z0000 Encounter for general adult medical examination without abnormal findings: Secondary | ICD-10-CM | POA: Diagnosis not present

## 2016-12-05 DIAGNOSIS — Z125 Encounter for screening for malignant neoplasm of prostate: Secondary | ICD-10-CM | POA: Diagnosis not present

## 2016-12-05 DIAGNOSIS — Z720 Tobacco use: Secondary | ICD-10-CM

## 2016-12-05 DIAGNOSIS — D696 Thrombocytopenia, unspecified: Secondary | ICD-10-CM

## 2016-12-05 DIAGNOSIS — E559 Vitamin D deficiency, unspecified: Secondary | ICD-10-CM

## 2016-12-05 DIAGNOSIS — I8392 Asymptomatic varicose veins of left lower extremity: Secondary | ICD-10-CM

## 2016-12-05 DIAGNOSIS — R030 Elevated blood-pressure reading, without diagnosis of hypertension: Secondary | ICD-10-CM | POA: Diagnosis not present

## 2016-12-05 LAB — CBC WITH DIFFERENTIAL/PLATELET
BASOS ABS: 0.1 10*3/uL (ref 0.0–0.1)
Basophils Relative: 2.9 % (ref 0.0–3.0)
EOS ABS: 0.2 10*3/uL (ref 0.0–0.7)
EOS PCT: 4.3 % (ref 0.0–5.0)
HCT: 44.4 % (ref 39.0–52.0)
HEMOGLOBIN: 15.1 g/dL (ref 13.0–17.0)
Lymphocytes Relative: 44.5 % (ref 12.0–46.0)
Lymphs Abs: 1.7 10*3/uL (ref 0.7–4.0)
MCHC: 34 g/dL (ref 30.0–36.0)
MCV: 95.9 fl (ref 78.0–100.0)
MONO ABS: 0.3 10*3/uL (ref 0.1–1.0)
Monocytes Relative: 7.5 % (ref 3.0–12.0)
Neutro Abs: 1.6 10*3/uL (ref 1.4–7.7)
Neutrophils Relative %: 40.8 % — ABNORMAL LOW (ref 43.0–77.0)
Platelets: 137 10*3/uL — ABNORMAL LOW (ref 150.0–400.0)
RBC: 4.63 Mil/uL (ref 4.22–5.81)
RDW: 14.4 % (ref 11.5–15.5)
WBC: 3.9 10*3/uL — AB (ref 4.0–10.5)

## 2016-12-05 LAB — VITAMIN D 25 HYDROXY (VIT D DEFICIENCY, FRACTURES): VITD: 11.99 ng/mL — ABNORMAL LOW (ref 30.00–100.00)

## 2016-12-05 LAB — LIPID PANEL
Cholesterol: 160 mg/dL (ref 0–200)
HDL: 33.5 mg/dL — AB (ref >39.00)
LDL Cholesterol: 103 mg/dL — ABNORMAL HIGH (ref 0–99)
NonHDL: 126.24
Total CHOL/HDL Ratio: 5
Triglycerides: 116 mg/dL (ref 0.0–149.0)
VLDL: 23.2 mg/dL (ref 0.0–40.0)

## 2016-12-05 LAB — COMPREHENSIVE METABOLIC PANEL
ALT: 8 U/L (ref 0–53)
AST: 13 U/L (ref 0–37)
Albumin: 4.3 g/dL (ref 3.5–5.2)
Alkaline Phosphatase: 45 U/L (ref 39–117)
BUN: 11 mg/dL (ref 6–23)
CO2: 31 meq/L (ref 19–32)
Calcium: 9.6 mg/dL (ref 8.4–10.5)
Chloride: 107 mEq/L (ref 96–112)
Creatinine, Ser: 1.27 mg/dL (ref 0.40–1.50)
GFR: 71.09 mL/min (ref >60.00)
GLUCOSE: 78 mg/dL (ref 70–99)
POTASSIUM: 5 meq/L (ref 3.5–5.1)
SODIUM: 143 meq/L (ref 135–145)
TOTAL PROTEIN: 6.5 g/dL (ref 6.0–8.3)
Total Bilirubin: 0.6 mg/dL (ref 0.2–1.2)

## 2016-12-05 LAB — TSH: TSH: 1.82 u[IU]/mL (ref 0.35–4.50)

## 2016-12-05 LAB — PSA, MEDICARE: PSA: 0.9 ng/ml (ref 0.10–4.00)
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
## 2016-12-05 NOTE — Progress Notes (Signed)
Pre-visit discussion using our clinic review tool. No additional management support is needed unless otherwise documented below in the visit note.  

## 2016-12-05 NOTE — Assessment & Plan Note (Signed)
Physical today 12/05/16.  Colonoscopy 06/16/16 - multiple polyps (multiple tubular adenomas and hyperplastic polyps).  Check psa with todays labs.

## 2016-12-06 ENCOUNTER — Other Ambulatory Visit: Payer: Self-pay | Admitting: Internal Medicine

## 2016-12-06 DIAGNOSIS — D696 Thrombocytopenia, unspecified: Secondary | ICD-10-CM

## 2016-12-06 DIAGNOSIS — D72819 Decreased white blood cell count, unspecified: Secondary | ICD-10-CM

## 2016-12-06 MED ORDER — ERGOCALCIFEROL 1.25 MG (50000 UT) PO CAPS: 50000.0000 [IU] | ORAL_CAPSULE | ORAL | 3 refills | Status: DC

## 2016-12-06 NOTE — Progress Notes (Signed)
Order placed for f/u cbc.   

## 2016-12-07 ENCOUNTER — Telehealth: Payer: Self-pay | Admitting: *Deleted

## 2016-12-07 NOTE — Telephone Encounter (Signed)
Patient requested a call about labs  Pt contact [phone]

## 2016-12-08 NOTE — Telephone Encounter (Signed)
See lab note.  

## 2016-12-09 ENCOUNTER — Encounter: Payer: Self-pay | Admitting: Internal Medicine

## 2016-12-09 NOTE — Assessment & Plan Note (Signed)
Blood pressure as outlined.  Follow pressures.  Doing well.  On no medication.

## 2016-12-09 NOTE — Assessment & Plan Note (Signed)
Discussed the need to quit smoking.  He declines.  Discussed the importance of stopping smoking.  Discussed screening CT chest.  States he will think about this and let me know if agrees.

## 2016-12-09 NOTE — Assessment & Plan Note (Signed)
Compression hose.  Follow.

## 2016-12-09 NOTE — Assessment & Plan Note (Signed)
Continue vitamin D supplements.  Follow vitamin D level.   

## 2016-12-09 NOTE — Assessment & Plan Note (Signed)
Last platelet count wnl.  Recheck cbc.   

## 2016-12-27 ENCOUNTER — Other Ambulatory Visit (INDEPENDENT_AMBULATORY_CARE_PROVIDER_SITE_OTHER): Payer: Commercial Managed Care - HMO

## 2016-12-27 DIAGNOSIS — D72819 Decreased white blood cell count, unspecified: Secondary | ICD-10-CM

## 2016-12-27 DIAGNOSIS — D696 Thrombocytopenia, unspecified: Secondary | ICD-10-CM | POA: Diagnosis not present

## 2016-12-27 LAB — CBC WITH DIFFERENTIAL/PLATELET
Basophils Absolute: 0.1 10*3/uL (ref 0.0–0.1)
Basophils Relative: 2.2 % (ref 0.0–3.0)
EOS ABS: 0.2 10*3/uL (ref 0.0–0.7)
EOS PCT: 3.5 % (ref 0.0–5.0)
HEMATOCRIT: 44.9 % (ref 39.0–52.0)
HEMOGLOBIN: 15.3 g/dL (ref 13.0–17.0)
LYMPHS PCT: 45.9 % (ref 12.0–46.0)
Lymphs Abs: 2.5 10*3/uL (ref 0.7–4.0)
MCHC: 34.1 g/dL (ref 30.0–36.0)
MCV: 95.5 fl (ref 78.0–100.0)
MONO ABS: 0.4 10*3/uL (ref 0.1–1.0)
Monocytes Relative: 7.7 % (ref 3.0–12.0)
Neutro Abs: 2.2 10*3/uL (ref 1.4–7.7)
Neutrophils Relative %: 40.7 % — ABNORMAL LOW (ref 43.0–77.0)
Platelets: 158 10*3/uL (ref 150.0–400.0)
RBC: 4.7 Mil/uL (ref 4.22–5.81)
RDW: 14.1 % (ref 11.5–15.5)
WBC: 5.3 10*3/uL (ref 4.0–10.5)

## 2017-05-17 ENCOUNTER — Ambulatory Visit (INDEPENDENT_AMBULATORY_CARE_PROVIDER_SITE_OTHER): Payer: Commercial Managed Care - HMO | Admitting: *Deleted

## 2017-05-17 DIAGNOSIS — Z23 Encounter for immunization: Secondary | ICD-10-CM

## 2017-06-12 ENCOUNTER — Ambulatory Visit (INDEPENDENT_AMBULATORY_CARE_PROVIDER_SITE_OTHER): Payer: Medicare PPO | Admitting: Internal Medicine

## 2017-06-12 ENCOUNTER — Encounter: Payer: Self-pay | Admitting: Internal Medicine

## 2017-06-12 VITALS — BP 132/86 | HR 60 | Temp 97.6°F | Resp 15 | Ht 72.05 in | Wt 181.4 lb

## 2017-06-12 DIAGNOSIS — Z13220 Encounter for screening for lipoid disorders: Secondary | ICD-10-CM

## 2017-06-12 DIAGNOSIS — M545 Low back pain, unspecified: Secondary | ICD-10-CM

## 2017-06-12 DIAGNOSIS — Z720 Tobacco use: Secondary | ICD-10-CM

## 2017-06-12 DIAGNOSIS — D696 Thrombocytopenia, unspecified: Secondary | ICD-10-CM | POA: Diagnosis not present

## 2017-06-12 DIAGNOSIS — E559 Vitamin D deficiency, unspecified: Secondary | ICD-10-CM

## 2017-06-12 LAB — VITAMIN D 25 HYDROXY (VIT D DEFICIENCY, FRACTURES): VITD: 13.11 ng/mL — ABNORMAL LOW (ref 30.00–100.00)
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
## 2017-06-13 MED ORDER — ERGOCALCIFEROL 1.25 MG (50000 UT) PO CAPS: 50000.0000 [IU] | ORAL_CAPSULE | ORAL | 5 refills | Status: DC

## 2017-06-15 ENCOUNTER — Encounter: Payer: Self-pay | Admitting: Internal Medicine

## 2017-06-15 NOTE — Assessment & Plan Note (Signed)
Stable

## 2017-06-15 NOTE — Assessment & Plan Note (Signed)
Recheck vitamin D level.  Continue supplements.

## 2017-06-15 NOTE — Assessment & Plan Note (Signed)
Discussed the need to quit.  Discussed screening CT scan.  States he will think about the scan.  Declines to quit smoking.

## 2017-06-15 NOTE — Assessment & Plan Note (Signed)
Last platelet count check wnl.  Follow cbc.

## 2017-08-15 ENCOUNTER — Ambulatory Visit (INDEPENDENT_AMBULATORY_CARE_PROVIDER_SITE_OTHER): Payer: Medicare PPO

## 2017-08-15 VITALS — BP 118/70 | HR 63 | Temp 97.3°F | Resp 14 | Ht 72.0 in | Wt 182.4 lb

## 2017-08-15 DIAGNOSIS — Z1331 Encounter for screening for depression: Secondary | ICD-10-CM | POA: Diagnosis not present

## 2017-08-15 DIAGNOSIS — Z0000 Encounter for general adult medical examination without abnormal findings: Secondary | ICD-10-CM | POA: Diagnosis not present
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
## 2017-08-15 NOTE — Patient Instructions (Addendum)
  Mr. Stutz , Thank you for taking time to come for your Medicare Wellness Visit. I appreciate your ongoing commitment to your health goals. Please review the following plan we discussed and let me know if I can assist you in the future.   .Follow up with Dr. Nicki Reaper as needed.    Have a great day!  These are the goals we discussed: Goals    . Increase water intake     Stay hydrated!  Drink plenty of fluids.  Substitute coffee/pepsi for water.       This is a list of the screening recommended for you and due dates:  Health Maintenance  Topic Date Due  . Tetanus Vaccine  11/22/1960  . Colon Cancer Screening  06/16/2026  . Flu Shot  Completed  . Pneumonia vaccines  Completed

## 2017-11-30 ENCOUNTER — Other Ambulatory Visit (INDEPENDENT_AMBULATORY_CARE_PROVIDER_SITE_OTHER): Payer: Medicare PPO

## 2017-11-30 DIAGNOSIS — Z13220 Encounter for screening for lipoid disorders: Secondary | ICD-10-CM | POA: Diagnosis not present

## 2017-11-30 DIAGNOSIS — D696 Thrombocytopenia, unspecified: Secondary | ICD-10-CM | POA: Diagnosis not present

## 2017-11-30 LAB — CBC WITH DIFFERENTIAL/PLATELET
BASOS PCT: 1.3 % (ref 0.0–3.0)
Basophils Absolute: 0.1 10*3/uL (ref 0.0–0.1)
EOS ABS: 0.1 10*3/uL (ref 0.0–0.7)
EOS PCT: 3.1 % (ref 0.0–5.0)
HEMATOCRIT: 45.5 % (ref 39.0–52.0)
HEMOGLOBIN: 15.4 g/dL (ref 13.0–17.0)
LYMPHS PCT: 37.3 % (ref 12.0–46.0)
Lymphs Abs: 1.6 10*3/uL (ref 0.7–4.0)
MCHC: 33.9 g/dL (ref 30.0–36.0)
MCV: 96.6 fl (ref 78.0–100.0)
Monocytes Absolute: 0.3 10*3/uL (ref 0.1–1.0)
Monocytes Relative: 8.3 % (ref 3.0–12.0)
Neutro Abs: 2.1 10*3/uL (ref 1.4–7.7)
Neutrophils Relative %: 50 % (ref 43.0–77.0)
Platelets: 171 10*3/uL (ref 150.0–400.0)
RBC: 4.71 Mil/uL (ref 4.22–5.81)
RDW: 13.7 % (ref 11.5–15.5)
WBC: 4.2 10*3/uL (ref 4.0–10.5)

## 2017-11-30 LAB — LIPID PANEL
CHOL/HDL RATIO: 4
CHOLESTEROL: 136 mg/dL (ref 0–200)
HDL: 31.6 mg/dL — ABNORMAL LOW (ref >39.00)
LDL CALC: 88 mg/dL (ref 0–99)
NonHDL: 104.16
TRIGLYCERIDES: 83 mg/dL (ref 0.0–149.0)
VLDL: 16.6 mg/dL (ref 0.0–40.0)

## 2017-11-30 LAB — COMPREHENSIVE METABOLIC PANEL
ALBUMIN: 3.9 g/dL (ref 3.5–5.2)
ALT: 8 U/L (ref 0–53)
AST: 12 U/L (ref 0–37)
Alkaline Phosphatase: 40 U/L (ref 39–117)
BUN: 13 mg/dL (ref 6–23)
CALCIUM: 9.5 mg/dL (ref 8.4–10.5)
CHLORIDE: 109 meq/L (ref 96–112)
CO2: 28 meq/L (ref 19–32)
Creatinine, Ser: 1.36 mg/dL (ref 0.40–1.50)
GFR: 65.52 mL/min (ref >60.00)
Glucose, Bld: 97 mg/dL (ref 70–99)
POTASSIUM: 4.1 meq/L (ref 3.5–5.1)
Sodium: 143 mEq/L (ref 135–145)
Total Bilirubin: 0.6 mg/dL (ref 0.2–1.2)
Total Protein: 6.7 g/dL (ref 6.0–8.3)

## 2017-12-06 ENCOUNTER — Encounter: Payer: Self-pay | Admitting: Internal Medicine

## 2017-12-06 ENCOUNTER — Ambulatory Visit (INDEPENDENT_AMBULATORY_CARE_PROVIDER_SITE_OTHER): Payer: Medicare PPO | Admitting: Internal Medicine

## 2017-12-06 VITALS — BP 134/78 | HR 58 | Temp 97.5°F | Resp 18 | Wt 179.6 lb

## 2017-12-06 DIAGNOSIS — Z125 Encounter for screening for malignant neoplasm of prostate: Secondary | ICD-10-CM

## 2017-12-06 DIAGNOSIS — D696 Thrombocytopenia, unspecified: Secondary | ICD-10-CM

## 2017-12-06 DIAGNOSIS — Z86010 Personal history of colonic polyps: Secondary | ICD-10-CM | POA: Diagnosis not present

## 2017-12-06 DIAGNOSIS — E559 Vitamin D deficiency, unspecified: Secondary | ICD-10-CM | POA: Diagnosis not present

## 2017-12-06 DIAGNOSIS — Z0000 Encounter for general adult medical examination without abnormal findings: Secondary | ICD-10-CM | POA: Diagnosis not present

## 2017-12-06 DIAGNOSIS — Z720 Tobacco use: Secondary | ICD-10-CM | POA: Diagnosis not present

## 2017-12-06 LAB — PSA, MEDICARE: PSA: 1.05 ng/ml (ref 0.10–4.00)

## 2017-12-06 LAB — VITAMIN D 25 HYDROXY (VIT D DEFICIENCY, FRACTURES): VITD: 21.78 ng/mL — ABNORMAL LOW (ref 30.00–100.00)

## 2017-12-06 NOTE — Assessment & Plan Note (Signed)
Colonoscopy 01/2016 as outlined.  States recommended f/u colonoscopy in 3 years.

## 2017-12-06 NOTE — Assessment & Plan Note (Signed)
Last platelet count wnl.  Follow.  

## 2017-12-06 NOTE — Assessment & Plan Note (Signed)
On rx vitamin D.  Recheck vitamin D level.

## 2017-12-06 NOTE — Assessment & Plan Note (Signed)
Discussed the need to stop smoking.  Also discussed screening CT chest.  He desires not to pursue CT at this time.  Also declines to quit smoking.  Follow.

## 2017-12-06 NOTE — Assessment & Plan Note (Signed)
Physical today 12/06/17.  Colonoscopy 06/16/16 - multiple polyps (multiple tubular adenomas and hyperplastic polyps).  Check psa.
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
## 2018-03-04 DIAGNOSIS — K219 Gastro-esophageal reflux disease without esophagitis: Secondary | ICD-10-CM | POA: Diagnosis not present

## 2018-03-04 DIAGNOSIS — Z6821 Body mass index (BMI) 21.0-21.9, adult: Secondary | ICD-10-CM | POA: Diagnosis not present

## 2018-03-04 DIAGNOSIS — F17200 Nicotine dependence, unspecified, uncomplicated: Secondary | ICD-10-CM | POA: Diagnosis not present

## 2018-04-29 ENCOUNTER — Ambulatory Visit (INDEPENDENT_AMBULATORY_CARE_PROVIDER_SITE_OTHER): Payer: Medicare PPO

## 2018-04-29 DIAGNOSIS — Z23 Encounter for immunization: Secondary | ICD-10-CM | POA: Diagnosis not present

## 2018-06-06 ENCOUNTER — Ambulatory Visit (INDEPENDENT_AMBULATORY_CARE_PROVIDER_SITE_OTHER): Payer: Medicare PPO | Admitting: Internal Medicine

## 2018-06-06 DIAGNOSIS — E559 Vitamin D deficiency, unspecified: Secondary | ICD-10-CM | POA: Diagnosis not present

## 2018-06-06 DIAGNOSIS — Z720 Tobacco use: Secondary | ICD-10-CM

## 2018-06-06 DIAGNOSIS — D696 Thrombocytopenia, unspecified: Secondary | ICD-10-CM | POA: Diagnosis not present
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
## 2018-06-09 ENCOUNTER — Encounter: Payer: Self-pay | Admitting: Internal Medicine

## 2018-06-09 NOTE — Assessment & Plan Note (Signed)
Follow cbc.  

## 2018-06-09 NOTE — Assessment & Plan Note (Signed)
Taking otc vitamin D now.  Follow vitamin D level.

## 2018-06-09 NOTE — Assessment & Plan Note (Signed)
Discussed screening CT.  He declines.  Have discussed the need to quit smoking.  Follow.

## 2018-08-16 ENCOUNTER — Ambulatory Visit (INDEPENDENT_AMBULATORY_CARE_PROVIDER_SITE_OTHER): Payer: Medicare PPO

## 2018-08-16 VITALS — BP 130/80 | HR 54 | Temp 97.5°F | Resp 15 | Ht 72.5 in | Wt 179.8 lb

## 2018-08-16 DIAGNOSIS — Z0000 Encounter for general adult medical examination without abnormal findings: Secondary | ICD-10-CM | POA: Diagnosis not present

## 2018-08-16 NOTE — Patient Instructions (Addendum)
  Michael Wiley , Thank you for taking time to come for your Medicare Wellness Visit. I appreciate your ongoing commitment to your health goals. Please review the following plan we discussed and let me know if I can assist you in the future.   These are the goals we discussed: Goals    . Maintain Healthy Lifestyle     Stay hydrated  Stay active Healthy diet       This is a list of the screening recommended for you and due dates:  Health Maintenance  Topic Date Due  . Tetanus Vaccine  11/22/1960  . Flu Shot  Completed  . Pneumonia vaccines  Completed
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
## 2018-12-11 ENCOUNTER — Telehealth: Payer: Self-pay

## 2018-12-11 NOTE — Telephone Encounter (Signed)
Copied from [place] [id number]. Topic: General - Other >> Dec 11, 2018  2:55 PM Celene Kras A wrote: Reason for CRM: Pt called back to answer questions for pre screening. Attempted to contact the office.

## 2018-12-12 ENCOUNTER — Other Ambulatory Visit: Payer: Self-pay

## 2018-12-13 ENCOUNTER — Ambulatory Visit (INDEPENDENT_AMBULATORY_CARE_PROVIDER_SITE_OTHER): Payer: 59 | Admitting: Internal Medicine

## 2018-12-13 ENCOUNTER — Encounter: Payer: Self-pay | Admitting: Internal Medicine

## 2018-12-13 ENCOUNTER — Other Ambulatory Visit: Payer: Self-pay

## 2018-12-13 VITALS — BP 128/70 | HR 56 | Temp 98.0°F | Resp 18 | Wt 178.0 lb

## 2018-12-13 DIAGNOSIS — Z125 Encounter for screening for malignant neoplasm of prostate: Secondary | ICD-10-CM

## 2018-12-13 DIAGNOSIS — Z0000 Encounter for general adult medical examination without abnormal findings: Secondary | ICD-10-CM

## 2018-12-13 DIAGNOSIS — Z720 Tobacco use: Secondary | ICD-10-CM | POA: Diagnosis not present

## 2018-12-13 DIAGNOSIS — E559 Vitamin D deficiency, unspecified: Secondary | ICD-10-CM | POA: Diagnosis not present

## 2018-12-13 DIAGNOSIS — Z9109 Other allergy status, other than to drugs and biological substances: Secondary | ICD-10-CM | POA: Diagnosis not present

## 2018-12-13 DIAGNOSIS — D696 Thrombocytopenia, unspecified: Secondary | ICD-10-CM

## 2018-12-13 DIAGNOSIS — M545 Low back pain, unspecified: Secondary | ICD-10-CM

## 2018-12-13 LAB — CBC WITH DIFFERENTIAL/PLATELET
Basophils Absolute: 0.1 10*3/uL (ref 0.0–0.1)
Basophils Relative: 1.5 % (ref 0.0–3.0)
Eosinophils Absolute: 0.1 10*3/uL (ref 0.0–0.7)
Eosinophils Relative: 2.4 % (ref 0.0–5.0)
HCT: 44.3 % (ref 39.0–52.0)
Hemoglobin: 15.2 g/dL (ref 13.0–17.0)
Lymphocytes Relative: 40 % (ref 12.0–46.0)
Lymphs Abs: 1.7 10*3/uL (ref 0.7–4.0)
MCHC: 34.2 g/dL (ref 30.0–36.0)
MCV: 96.8 fl (ref 78.0–100.0)
Monocytes Absolute: 0.3 10*3/uL (ref 0.1–1.0)
Monocytes Relative: 6.4 % (ref 3.0–12.0)
Neutro Abs: 2.1 10*3/uL (ref 1.4–7.7)
Neutrophils Relative %: 49.7 % (ref 43.0–77.0)
Platelets: 143 10*3/uL — ABNORMAL LOW (ref 150.0–400.0)
RBC: 4.58 Mil/uL (ref 4.22–5.81)
RDW: 13.9 % (ref 11.5–15.5)
WBC: 4.2 10*3/uL (ref 4.0–10.5)

## 2018-12-13 LAB — COMPREHENSIVE METABOLIC PANEL
ALT: 10 U/L (ref 0–53)
AST: 14 U/L (ref 0–37)
Albumin: 4.1 g/dL (ref 3.5–5.2)
Alkaline Phosphatase: 45 U/L (ref 39–117)
BUN: 10 mg/dL (ref 6–23)
CO2: 28 mEq/L (ref 19–32)
Calcium: 9.5 mg/dL (ref 8.4–10.5)
Chloride: 106 mEq/L (ref 96–112)
Creatinine, Ser: 1.27 mg/dL (ref 0.40–1.50)
GFR: 66.53 mL/min (ref >60.00)
Glucose, Bld: 81 mg/dL (ref 70–99)
Potassium: 4.6 mEq/L (ref 3.5–5.1)
Sodium: 141 mEq/L (ref 135–145)
Total Bilirubin: 0.7 mg/dL (ref 0.2–1.2)
Total Protein: 6.5 g/dL (ref 6.0–8.3)

## 2018-12-13 LAB — VITAMIN D 25 HYDROXY (VIT D DEFICIENCY, FRACTURES): VITD: 15.94 ng/mL — ABNORMAL LOW (ref 30.00–100.00)

## 2018-12-13 LAB — PSA, MEDICARE: PSA: 1.09 ng/ml (ref 0.10–4.00)

## 2018-12-13 LAB — TSH: TSH: 1.11 u[IU]/mL (ref 0.35–4.50)

## 2018-12-13 NOTE — Assessment & Plan Note (Signed)
Physical today 12/13/18.  Colonoscopy 06/16/16 - multiple polpys (multiple tubular adenomas and hyperplastic polyps).  Check psa today.

## 2018-12-13 NOTE — Assessment & Plan Note (Signed)
Follow cbc.  Recheck today.   

## 2018-12-13 NOTE — Assessment & Plan Note (Signed)
Check vitamin D level today 
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
## 2018-12-15 ENCOUNTER — Encounter: Payer: Self-pay | Admitting: Internal Medicine

## 2018-12-15 ENCOUNTER — Other Ambulatory Visit: Payer: Self-pay | Admitting: Internal Medicine

## 2018-12-15 DIAGNOSIS — D696 Thrombocytopenia, unspecified: Secondary | ICD-10-CM

## 2018-12-15 DIAGNOSIS — Z13220 Encounter for screening for lipoid disorders: Secondary | ICD-10-CM

## 2018-12-15 NOTE — Assessment & Plan Note (Signed)
Stable.  States if stays active, pain better.

## 2018-12-15 NOTE — Assessment & Plan Note (Signed)
Discussed the need to quit smoking today.  He declines.  Discussed screening CT.  He declines.  Will notify me if changes his mind.

## 2018-12-15 NOTE — Progress Notes (Signed)
Order placed for f/u labs.  

## 2018-12-15 NOTE — Assessment & Plan Note (Signed)
Controlled.  Follow.   

## 2018-12-27 ENCOUNTER — Other Ambulatory Visit (INDEPENDENT_AMBULATORY_CARE_PROVIDER_SITE_OTHER): Payer: Medicare PPO

## 2018-12-27 ENCOUNTER — Other Ambulatory Visit: Payer: Self-pay

## 2018-12-27 DIAGNOSIS — Z13220 Encounter for screening for lipoid disorders: Secondary | ICD-10-CM | POA: Diagnosis not present

## 2018-12-27 DIAGNOSIS — E785 Hyperlipidemia, unspecified: Secondary | ICD-10-CM

## 2018-12-27 DIAGNOSIS — D696 Thrombocytopenia, unspecified: Secondary | ICD-10-CM

## 2018-12-27 LAB — LIPID PANEL
Cholesterol: 155 mg/dL (ref 0–200)
HDL: 33.5 mg/dL — ABNORMAL LOW (ref >39.00)
LDL Cholesterol: 96 mg/dL (ref 0–99)
NonHDL: 121.68
Total CHOL/HDL Ratio: 5
Triglycerides: 130 mg/dL (ref 0.0–149.0)
VLDL: 26 mg/dL (ref 0.0–40.0)

## 2018-12-27 LAB — PLATELET COUNT: Platelets: 153 10*3/uL (ref 140–400)

## 2018-12-31 MED ORDER — ROSUVASTATIN CALCIUM 10 MG PO TABS: 10.0000 mg | ORAL_TABLET | Freq: Every day | ORAL | 3 refills | Status: DC

## 2018-12-31 NOTE — Addendum Note (Signed)
Addended by: Lars Masson on: 12/31/2018 01:49 PM   Modules accepted: Orders

## 2019-02-11 ENCOUNTER — Other Ambulatory Visit: Payer: Self-pay

## 2019-02-11 ENCOUNTER — Other Ambulatory Visit (INDEPENDENT_AMBULATORY_CARE_PROVIDER_SITE_OTHER): Payer: Medicare PPO

## 2019-02-11 DIAGNOSIS — E785 Hyperlipidemia, unspecified: Secondary | ICD-10-CM | POA: Diagnosis not present

## 2019-02-12 ENCOUNTER — Other Ambulatory Visit: Payer: Self-pay | Admitting: Internal Medicine

## 2019-02-12 DIAGNOSIS — E7800 Pure hypercholesterolemia, unspecified: Secondary | ICD-10-CM

## 2019-02-12 LAB — HEPATIC FUNCTION PANEL
ALT: 7 U/L (ref 0–53)
AST: 13 U/L (ref 0–37)
Albumin: 4.4 g/dL (ref 3.5–5.2)
Alkaline Phosphatase: 46 U/L (ref 39–117)
Bilirubin, Direct: 0.1 mg/dL (ref 0.0–0.3)
Total Bilirubin: 0.8 mg/dL (ref 0.2–1.2)
Total Protein: 6.5 g/dL (ref 6.0–8.3)

## 2019-02-12 NOTE — Progress Notes (Signed)
Order placed for f/u fasting labs.  

## 2019-03-14 ENCOUNTER — Encounter: Payer: Self-pay | Admitting: *Deleted

## 2019-03-21 DIAGNOSIS — Z9849 Cataract extraction status, unspecified eye: Secondary | ICD-10-CM | POA: Diagnosis not present

## 2019-03-21 DIAGNOSIS — F17210 Nicotine dependence, cigarettes, uncomplicated: Secondary | ICD-10-CM | POA: Diagnosis not present

## 2019-04-04 ENCOUNTER — Other Ambulatory Visit: Payer: Self-pay

## 2019-04-04 ENCOUNTER — Ambulatory Visit (INDEPENDENT_AMBULATORY_CARE_PROVIDER_SITE_OTHER): Payer: Medicare PPO

## 2019-04-04 DIAGNOSIS — Z23 Encounter for immunization: Secondary | ICD-10-CM

## 2019-04-09 DIAGNOSIS — Z20828 Contact with and (suspected) exposure to other viral communicable diseases: Secondary | ICD-10-CM | POA: Diagnosis not present

## 2019-04-10 DIAGNOSIS — Z20828 Contact with and (suspected) exposure to other viral communicable diseases: Secondary | ICD-10-CM | POA: Diagnosis not present

## 2019-04-29 ENCOUNTER — Emergency Department
Admission: EM | Admit: 2019-04-29 | Discharge: 2019-04-29 | Disposition: A | Discharge status: Home / Self Care | Payer: Medicare PPO | Attending: Emergency Medicine | Admitting: Emergency Medicine

## 2019-04-29 ENCOUNTER — Encounter: Payer: Self-pay | Admitting: Emergency Medicine

## 2019-04-29 ENCOUNTER — Other Ambulatory Visit: Payer: Self-pay

## 2019-04-29 DIAGNOSIS — T63441A Toxic effect of venom of bees, accidental (unintentional), initial encounter: Secondary | ICD-10-CM | POA: Diagnosis not present

## 2019-04-29 DIAGNOSIS — F17210 Nicotine dependence, cigarettes, uncomplicated: Secondary | ICD-10-CM | POA: Diagnosis not present

## 2019-04-29 DIAGNOSIS — T63481A Toxic effect of venom of other arthropod, accidental (unintentional), initial encounter: Secondary | ICD-10-CM | POA: Insufficient documentation

## 2019-04-29 DIAGNOSIS — Z79899 Other long term (current) drug therapy: Secondary | ICD-10-CM | POA: Diagnosis not present

## 2019-04-29 DIAGNOSIS — S60561A Insect bite (nonvenomous) of right hand, initial encounter: Secondary | ICD-10-CM | POA: Diagnosis not present

## 2019-04-29 DIAGNOSIS — R2231 Localized swelling, mass and lump, right upper limb: Secondary | ICD-10-CM | POA: Diagnosis present

## 2019-04-29 MED ORDER — PREDNISONE 10 MG PO TABS: 30.0000 mg | ORAL_TABLET | Freq: Every day | ORAL | 0 refills | Status: AC

## 2019-04-29 MED ORDER — PREDNISONE 20 MG PO TABS
30.0000 mg | ORAL_TABLET | Freq: Once | ORAL | Status: AC
  Administered 2019-04-29: 04:00:00 30 mg via ORAL
  Filled 2019-04-29: qty 1

## 2019-04-29 NOTE — Discharge Instructions (Signed)
1.  Take steroid as prescribed (Prednisone 30 mg daily x4 days). 2.  You may either take Benadryl by mouth or apply topical Benadryl or hydrocortisone cream to affected area. 3.  Return to the ER for worsening symptoms, persistent vomiting, fever or other concerns.

## 2019-04-29 NOTE — ED Notes (Signed)
Pt was working in garden yesterday when he saw ants on his right hand. He brushed the off but has had itching, redness, and swelling to hand. States pain has improved but continues to have some redness and swelling. Pt did soak hand in epson salt yesterday.

## 2019-04-29 NOTE — ED Notes (Signed)
Patient discharged to home per MD order. Patient in stable condition, and deemed medically cleared by ED provider for discharge. Discharge instructions reviewed with patient/family using "Teach Back"; verbalized understanding of medication education and administration, and information about follow-up care. Denies further concerns. ° °
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
## 2019-04-29 NOTE — ED Triage Notes (Signed)
Patient ambulatory to triage with steady gait, without difficulty or distress noted, mask in place; pt reports painful swelling to rt hand since yesterday after being bit by ant

## 2019-07-28 DIAGNOSIS — Z8371 Family history of colonic polyps: Secondary | ICD-10-CM | POA: Diagnosis not present

## 2019-07-28 DIAGNOSIS — Z86010 Personal history of colonic polyps: Secondary | ICD-10-CM | POA: Diagnosis not present

## 2019-07-28 DIAGNOSIS — D696 Thrombocytopenia, unspecified: Secondary | ICD-10-CM | POA: Diagnosis not present

## 2019-07-28 DIAGNOSIS — Z720 Tobacco use: Secondary | ICD-10-CM | POA: Diagnosis not present

## 2019-07-28 DIAGNOSIS — E559 Vitamin D deficiency, unspecified: Secondary | ICD-10-CM | POA: Diagnosis not present

## 2019-08-21 ENCOUNTER — Telehealth: Payer: Self-pay | Admitting: Internal Medicine

## 2019-08-21 ENCOUNTER — Ambulatory Visit (INDEPENDENT_AMBULATORY_CARE_PROVIDER_SITE_OTHER): Payer: Medicare PPO

## 2019-08-21 ENCOUNTER — Encounter: Payer: Self-pay | Admitting: Internal Medicine

## 2019-08-21 ENCOUNTER — Encounter (INDEPENDENT_AMBULATORY_CARE_PROVIDER_SITE_OTHER): Payer: Self-pay

## 2019-08-21 ENCOUNTER — Ambulatory Visit (INDEPENDENT_AMBULATORY_CARE_PROVIDER_SITE_OTHER): Payer: Medicare PPO | Admitting: Internal Medicine

## 2019-08-21 ENCOUNTER — Other Ambulatory Visit: Payer: Self-pay

## 2019-08-21 VITALS — Ht 74.0 in | Wt 175.0 lb

## 2019-08-21 DIAGNOSIS — Z720 Tobacco use: Secondary | ICD-10-CM

## 2019-08-21 DIAGNOSIS — M545 Low back pain, unspecified: Secondary | ICD-10-CM

## 2019-08-21 DIAGNOSIS — D696 Thrombocytopenia, unspecified: Secondary | ICD-10-CM

## 2019-08-21 DIAGNOSIS — Z9109 Other allergy status, other than to drugs and biological substances: Secondary | ICD-10-CM | POA: Diagnosis not present

## 2019-08-21 DIAGNOSIS — Z0000 Encounter for general adult medical examination without abnormal findings: Secondary | ICD-10-CM | POA: Diagnosis not present

## 2019-08-21 DIAGNOSIS — E7800 Pure hypercholesterolemia, unspecified: Secondary | ICD-10-CM | POA: Diagnosis not present

## 2019-08-21 DIAGNOSIS — Z86010 Personal history of colonic polyps: Secondary | ICD-10-CM | POA: Diagnosis not present

## 2019-08-21 DIAGNOSIS — E559 Vitamin D deficiency, unspecified: Secondary | ICD-10-CM | POA: Diagnosis not present
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
## 2019-08-21 NOTE — Patient Instructions (Addendum)
  Michael Wiley , Thank you for taking time to come for your Medicare Wellness Visit. I appreciate your ongoing commitment to your health goals. Please review the following plan we discussed and let me know if I can assist you in the future.   These are the goals we discussed: Goals    . Maintain Healthy Lifestyle     Stay hydrated  Stay active Healthy diet       This is a list of the screening recommended for you and due dates:  Health Maintenance  Topic Date Due  . Tetanus Vaccine  08/20/2020*  . Flu Shot  Completed  . Pneumonia vaccines  Completed  *Topic was postponed. The date shown is not the original due date.
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
## 2019-08-21 NOTE — Telephone Encounter (Signed)
LMTCB and schedule fasting labs in the next 1-2 weeks and a cpe in 27m

## 2019-08-22 NOTE — Assessment & Plan Note (Signed)
Platelet count has been stable.  Recheck cbc.   

## 2019-08-22 NOTE — Assessment & Plan Note (Signed)
Controlled.  

## 2019-08-22 NOTE — Assessment & Plan Note (Signed)
Discussed the need to quit smoking.  He has cut back.  Desires not to quit at this time.  Also discussed screening CT chest.  He declines.  Will notify me if changes his mind.

## 2019-08-22 NOTE — Assessment & Plan Note (Signed)
colonoscpy 06/16/16 - multiple colon polyps (multiple tubular adenomas and hyperplastic polyps).  Scheduled for f/u colonoscopy 10/2019.

## 2019-08-22 NOTE — Assessment & Plan Note (Signed)
Follow vitamin D level.  

## 2019-08-22 NOTE — Assessment & Plan Note (Signed)
Low cholesterol diet and exercise.  Follow lipid panel.   

## 2019-08-22 NOTE — Assessment & Plan Note (Signed)
Stable.  Does not limit activity.

## 2019-08-26 ENCOUNTER — Other Ambulatory Visit: Payer: Self-pay

## 2019-08-28 ENCOUNTER — Other Ambulatory Visit: Payer: Self-pay

## 2019-08-28 ENCOUNTER — Other Ambulatory Visit (INDEPENDENT_AMBULATORY_CARE_PROVIDER_SITE_OTHER): Payer: Medicare PPO

## 2019-08-28 DIAGNOSIS — E559 Vitamin D deficiency, unspecified: Secondary | ICD-10-CM

## 2019-08-28 DIAGNOSIS — E7800 Pure hypercholesterolemia, unspecified: Secondary | ICD-10-CM

## 2019-08-28 DIAGNOSIS — D696 Thrombocytopenia, unspecified: Secondary | ICD-10-CM | POA: Diagnosis not present

## 2019-08-28 LAB — CBC WITH DIFFERENTIAL/PLATELET
Basophils Absolute: 0.1 10*3/uL (ref 0.0–0.1)
Basophils Relative: 1.1 % (ref 0.0–3.0)
Eosinophils Absolute: 0.2 10*3/uL (ref 0.0–0.7)
Eosinophils Relative: 3.6 % (ref 0.0–5.0)
HCT: 45.7 % (ref 39.0–52.0)
Hemoglobin: 15.4 g/dL (ref 13.0–17.0)
Lymphocytes Relative: 38.8 % (ref 12.0–46.0)
Lymphs Abs: 1.8 10*3/uL (ref 0.7–4.0)
MCHC: 33.7 g/dL (ref 30.0–36.0)
MCV: 96.8 fl (ref 78.0–100.0)
Monocytes Absolute: 0.3 10*3/uL (ref 0.1–1.0)
Monocytes Relative: 6.9 % (ref 3.0–12.0)
Neutro Abs: 2.2 10*3/uL (ref 1.4–7.7)
Neutrophils Relative %: 49.6 % (ref 43.0–77.0)
Platelets: 145 10*3/uL — ABNORMAL LOW (ref 150.0–400.0)
RBC: 4.72 Mil/uL (ref 4.22–5.81)
RDW: 13.9 % (ref 11.5–15.5)
WBC: 4.5 10*3/uL (ref 4.0–10.5)

## 2019-08-28 LAB — COMPREHENSIVE METABOLIC PANEL
ALT: 9 U/L (ref 0–53)
AST: 13 U/L (ref 0–37)
Albumin: 4.1 g/dL (ref 3.5–5.2)
Alkaline Phosphatase: 47 U/L (ref 39–117)
BUN: 11 mg/dL (ref 6–23)
CO2: 29 mEq/L (ref 19–32)
Calcium: 9.4 mg/dL (ref 8.4–10.5)
Chloride: 106 mEq/L (ref 96–112)
Creatinine, Ser: 1.37 mg/dL (ref 0.40–1.50)
GFR: 60.84 mL/min (ref >60.00)
Glucose, Bld: 91 mg/dL (ref 70–99)
Potassium: 4.8 mEq/L (ref 3.5–5.1)
Sodium: 141 mEq/L (ref 135–145)
Total Bilirubin: 0.6 mg/dL (ref 0.2–1.2)
Total Protein: 6.5 g/dL (ref 6.0–8.3)

## 2019-08-28 LAB — VITAMIN D 25 HYDROXY (VIT D DEFICIENCY, FRACTURES): VITD: 13.61 ng/mL — ABNORMAL LOW (ref 30.00–100.00)

## 2019-08-28 LAB — HEPATIC FUNCTION PANEL
ALT: 9 U/L (ref 0–53)
AST: 13 U/L (ref 0–37)
Albumin: 4.1 g/dL (ref 3.5–5.2)
Alkaline Phosphatase: 47 U/L (ref 39–117)
Bilirubin, Direct: 0.1 mg/dL (ref 0.0–0.3)
Total Bilirubin: 0.6 mg/dL (ref 0.2–1.2)
Total Protein: 6.5 g/dL (ref 6.0–8.3)

## 2019-08-28 LAB — LIPID PANEL
Cholesterol: 152 mg/dL (ref 0–200)
HDL: 34.6 mg/dL — ABNORMAL LOW (ref >39.00)
LDL Cholesterol: 98 mg/dL (ref 0–99)
NonHDL: 117.38
Total CHOL/HDL Ratio: 4
Triglycerides: 98 mg/dL (ref 0.0–149.0)
VLDL: 19.6 mg/dL (ref 0.0–40.0)

## 2019-08-29 ENCOUNTER — Other Ambulatory Visit: Payer: Self-pay | Admitting: Internal Medicine

## 2019-08-29 DIAGNOSIS — D696 Thrombocytopenia, unspecified: Secondary | ICD-10-CM

## 2019-08-29 NOTE — Progress Notes (Signed)
Order placed for f/u lab.   

## 2019-09-05 ENCOUNTER — Telehealth: Payer: Self-pay | Admitting: Internal Medicine

## 2019-09-05 NOTE — Telephone Encounter (Signed)
Pt called returning a call  °

## 2019-09-08 ENCOUNTER — Other Ambulatory Visit: Payer: Self-pay

## 2019-09-08 MED ORDER — ERGOCALCIFEROL 1.25 MG (50000 UT) PO CAPS: 50000.0000 [IU] | ORAL_CAPSULE | ORAL | 3 refills | Status: DC

## 2019-10-08 ENCOUNTER — Other Ambulatory Visit (INDEPENDENT_AMBULATORY_CARE_PROVIDER_SITE_OTHER): Payer: Medicare PPO

## 2019-10-08 ENCOUNTER — Other Ambulatory Visit: Payer: Self-pay

## 2019-10-08 DIAGNOSIS — D696 Thrombocytopenia, unspecified: Secondary | ICD-10-CM

## 2019-10-08 LAB — PLATELET COUNT: Platelets: 140 10*3/uL (ref 140–400)

## 2019-11-03 ENCOUNTER — Encounter: Admission: RE | Payer: Self-pay | Source: Home / Self Care

## 2019-11-03 ENCOUNTER — Ambulatory Visit: Admission: RE | Admit: 2019-11-03 | Payer: Medicare PPO | Source: Home / Self Care | Admitting: Internal Medicine

## 2019-11-03 SURGERY — COLONOSCOPY WITH PROPOFOL
Anesthesia: General

## 2019-11-05 DIAGNOSIS — M545 Low back pain: Secondary | ICD-10-CM | POA: Diagnosis not present

## 2019-11-05 DIAGNOSIS — Z6823 Body mass index (BMI) 23.0-23.9, adult: Secondary | ICD-10-CM | POA: Diagnosis not present

## 2019-11-05 DIAGNOSIS — Z720 Tobacco use: Secondary | ICD-10-CM | POA: Diagnosis not present

## 2019-11-05 DIAGNOSIS — Z9849 Cataract extraction status, unspecified eye: Secondary | ICD-10-CM | POA: Diagnosis not present

## 2019-11-05 DIAGNOSIS — E559 Vitamin D deficiency, unspecified: Secondary | ICD-10-CM | POA: Diagnosis not present

## 2019-11-05 DIAGNOSIS — E785 Hyperlipidemia, unspecified: Secondary | ICD-10-CM | POA: Diagnosis not present

## 2020-01-29 ENCOUNTER — Telehealth: Payer: Self-pay | Admitting: Internal Medicine

## 2020-01-29 DIAGNOSIS — L600 Ingrowing nail: Secondary | ICD-10-CM

## 2020-01-29 NOTE — Telephone Encounter (Signed)
Called patient but was unable to leave message. Are you ok to place referral with out seeing him?

## 2020-01-29 NOTE — Addendum Note (Signed)
Addended by: Alisa Graff on: 01/29/2020 10:14 PM   Modules accepted: Orders

## 2020-01-29 NOTE — Telephone Encounter (Signed)
I have placed order for podiatry referral.  Someone should be contacting him with an appt date and time.

## 2020-01-29 NOTE — Telephone Encounter (Signed)
Pt would like a referral to [facility] for an ingrown toe nail

## 2020-02-11 DIAGNOSIS — L600 Ingrowing nail: Secondary | ICD-10-CM | POA: Diagnosis not present

## 2020-02-11 DIAGNOSIS — B351 Tinea unguium: Secondary | ICD-10-CM | POA: Diagnosis not present

## 2020-02-11 DIAGNOSIS — M79674 Pain in right toe(s): Secondary | ICD-10-CM | POA: Diagnosis not present

## 2020-02-24 ENCOUNTER — Ambulatory Visit (INDEPENDENT_AMBULATORY_CARE_PROVIDER_SITE_OTHER): Payer: Medicare PPO | Admitting: Internal Medicine

## 2020-02-24 ENCOUNTER — Encounter: Payer: Self-pay | Admitting: Internal Medicine

## 2020-02-24 ENCOUNTER — Other Ambulatory Visit: Payer: Self-pay

## 2020-02-24 VITALS — BP 112/60 | HR 62 | Temp 97.8°F | Ht 74.02 in | Wt 177.8 lb

## 2020-02-24 DIAGNOSIS — Z720 Tobacco use: Secondary | ICD-10-CM | POA: Diagnosis not present

## 2020-02-24 DIAGNOSIS — Z0000 Encounter for general adult medical examination without abnormal findings: Secondary | ICD-10-CM | POA: Diagnosis not present

## 2020-02-24 DIAGNOSIS — M545 Low back pain, unspecified: Secondary | ICD-10-CM

## 2020-02-24 DIAGNOSIS — Z86010 Personal history of colonic polyps: Secondary | ICD-10-CM | POA: Diagnosis not present

## 2020-02-24 DIAGNOSIS — D696 Thrombocytopenia, unspecified: Secondary | ICD-10-CM | POA: Diagnosis not present

## 2020-02-24 DIAGNOSIS — Z125 Encounter for screening for malignant neoplasm of prostate: Secondary | ICD-10-CM

## 2020-02-24 DIAGNOSIS — E559 Vitamin D deficiency, unspecified: Secondary | ICD-10-CM

## 2020-02-24 DIAGNOSIS — E7800 Pure hypercholesterolemia, unspecified: Secondary | ICD-10-CM | POA: Diagnosis not present

## 2020-02-24 NOTE — Assessment & Plan Note (Addendum)
Physical today 02/24/20.  Colonoscopy 06/2016 - multiple polyps (multiple tubular adenomas and hyperplastic polyps). Check psa today.
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
## 2020-02-25 LAB — LIPID PANEL
Cholesterol: 165 mg/dL (ref 0–200)
HDL: 31.8 mg/dL — ABNORMAL LOW (ref >39.00)
Total CHOL/HDL Ratio: 5
Triglycerides: 413 mg/dL — ABNORMAL HIGH (ref 0.0–149.0)

## 2020-02-25 LAB — HEPATIC FUNCTION PANEL
ALT: 9 U/L (ref 0–53)
AST: 14 U/L (ref 0–37)
Albumin: 4.4 g/dL (ref 3.5–5.2)
Alkaline Phosphatase: 47 U/L (ref 39–117)
Bilirubin, Direct: 0.1 mg/dL (ref 0.0–0.3)
Total Bilirubin: 0.4 mg/dL (ref 0.2–1.2)
Total Protein: 6.9 g/dL (ref 6.0–8.3)

## 2020-02-25 LAB — CBC WITH DIFFERENTIAL/PLATELET
Basophils Absolute: 0.1 10*3/uL (ref 0.0–0.1)
Basophils Relative: 1.3 % (ref 0.0–3.0)
Eosinophils Absolute: 0.2 10*3/uL (ref 0.0–0.7)
Eosinophils Relative: 3.4 % (ref 0.0–5.0)
HCT: 44.4 % (ref 39.0–52.0)
Hemoglobin: 15.1 g/dL (ref 13.0–17.0)
Lymphocytes Relative: 39.4 % (ref 12.0–46.0)
Lymphs Abs: 2.3 10*3/uL (ref 0.7–4.0)
MCHC: 34.1 g/dL (ref 30.0–36.0)
MCV: 96.7 fl (ref 78.0–100.0)
Monocytes Absolute: 0.4 10*3/uL (ref 0.1–1.0)
Monocytes Relative: 6.7 % (ref 3.0–12.0)
Neutro Abs: 2.8 10*3/uL (ref 1.4–7.7)
Neutrophils Relative %: 49.2 % (ref 43.0–77.0)
Platelets: 145 10*3/uL — ABNORMAL LOW (ref 150.0–400.0)
RBC: 4.59 Mil/uL (ref 4.22–5.81)
RDW: 14.2 % (ref 11.5–15.5)
WBC: 5.8 10*3/uL (ref 4.0–10.5)

## 2020-02-25 LAB — VITAMIN D 25 HYDROXY (VIT D DEFICIENCY, FRACTURES): VITD: 22.29 ng/mL — ABNORMAL LOW (ref 30.00–100.00)

## 2020-02-25 LAB — LDL CHOLESTEROL, DIRECT: Direct LDL: 89 mg/dL

## 2020-02-25 LAB — BASIC METABOLIC PANEL
BUN: 13 mg/dL (ref 6–23)
CO2: 24 mEq/L (ref 19–32)
Calcium: 10.2 mg/dL (ref 8.4–10.5)
Chloride: 108 mEq/L (ref 96–112)
Creatinine, Ser: 1.42 mg/dL (ref 0.40–1.50)
GFR: 58.3 mL/min — ABNORMAL LOW (ref >60.00)
Glucose, Bld: 73 mg/dL (ref 70–99)
Potassium: 4.5 mEq/L (ref 3.5–5.1)
Sodium: 144 mEq/L (ref 135–145)

## 2020-02-25 LAB — PSA, MEDICARE: PSA: 1.79 ng/ml (ref 0.10–4.00)

## 2020-02-25 LAB — TSH: TSH: 1.83 u[IU]/mL (ref 0.35–4.50)

## 2020-02-26 ENCOUNTER — Other Ambulatory Visit: Payer: Self-pay | Admitting: Internal Medicine

## 2020-02-26 DIAGNOSIS — R944 Abnormal results of kidney function studies: Secondary | ICD-10-CM

## 2020-02-26 NOTE — Progress Notes (Signed)
Order placed for f/u labs.  

## 2020-03-07 ENCOUNTER — Encounter: Payer: Self-pay | Admitting: Internal Medicine

## 2020-03-07 NOTE — Assessment & Plan Note (Signed)
Discussed the need to quit smoking.  Discussed screening CT chest.  He declines to quit.  Declines screening chest CT. Will notify me if changes her mind.

## 2020-03-07 NOTE — Assessment & Plan Note (Signed)
Doing well.  No significant back pain.  Follow.

## 2020-03-07 NOTE — Assessment & Plan Note (Signed)
Follow vitamin D level.  

## 2020-03-07 NOTE — Assessment & Plan Note (Signed)
Low cholesterol diet and exercise.  Follow lipid panel.   

## 2020-03-07 NOTE — Assessment & Plan Note (Signed)
Follow cbc.  

## 2020-03-08 DIAGNOSIS — H2512 Age-related nuclear cataract, left eye: Secondary | ICD-10-CM | POA: Diagnosis not present

## 2020-03-08 DIAGNOSIS — H40003 Preglaucoma, unspecified, bilateral: Secondary | ICD-10-CM | POA: Diagnosis not present

## 2020-04-19 ENCOUNTER — Other Ambulatory Visit (INDEPENDENT_AMBULATORY_CARE_PROVIDER_SITE_OTHER): Payer: Medicare PPO

## 2020-04-19 ENCOUNTER — Other Ambulatory Visit: Payer: Self-pay

## 2020-04-19 DIAGNOSIS — R944 Abnormal results of kidney function studies: Secondary | ICD-10-CM

## 2020-04-19 DIAGNOSIS — Z23 Encounter for immunization: Secondary | ICD-10-CM | POA: Diagnosis not present

## 2020-04-19 LAB — BASIC METABOLIC PANEL
BUN: 10 mg/dL (ref 6–23)
CO2: 29 mEq/L (ref 19–32)
Calcium: 9.3 mg/dL (ref 8.4–10.5)
Chloride: 106 mEq/L (ref 96–112)
Creatinine, Ser: 1.39 mg/dL (ref 0.40–1.50)
GFR: 59.74 mL/min — ABNORMAL LOW (ref >60.00)
Glucose, Bld: 83 mg/dL (ref 70–99)
Potassium: 4.2 mEq/L (ref 3.5–5.1)
Sodium: 141 mEq/L (ref 135–145)

## 2020-04-19 LAB — URINALYSIS, ROUTINE W REFLEX MICROSCOPIC
Bilirubin Urine: NEGATIVE
Hgb urine dipstick: NEGATIVE
Ketones, ur: NEGATIVE
Leukocytes,Ua: NEGATIVE
Nitrite: NEGATIVE
RBC / HPF: NONE SEEN (ref 0–?)
Specific Gravity, Urine: 1.025 (ref 1.000–1.030)
Total Protein, Urine: NEGATIVE
Urine Glucose: NEGATIVE
Urobilinogen, UA: 0.2 (ref 0.0–1.0)
WBC, UA: NONE SEEN (ref 0–?)
pH: 5.5 (ref 5.0–8.0)

## 2020-04-21 DIAGNOSIS — H2512 Age-related nuclear cataract, left eye: Secondary | ICD-10-CM | POA: Diagnosis not present

## 2020-04-21 DIAGNOSIS — M1991 Primary osteoarthritis, unspecified site: Secondary | ICD-10-CM | POA: Diagnosis not present

## 2020-04-27 ENCOUNTER — Other Ambulatory Visit: Payer: Self-pay

## 2020-04-27 ENCOUNTER — Encounter: Payer: Self-pay | Admitting: Ophthalmology

## 2020-05-03 ENCOUNTER — Other Ambulatory Visit: Payer: Self-pay

## 2020-05-03 ENCOUNTER — Other Ambulatory Visit
Admission: RE | Admit: 2020-05-03 | Discharge: 2020-05-03 | Disposition: A | Discharge status: Home / Self Care | Payer: Medicare PPO | Source: Ambulatory Visit | Attending: Ophthalmology | Admitting: Ophthalmology

## 2020-05-03 DIAGNOSIS — Z20822 Contact with and (suspected) exposure to covid-19: Secondary | ICD-10-CM | POA: Insufficient documentation

## 2020-05-03 DIAGNOSIS — Z01812 Encounter for preprocedural laboratory examination: Secondary | ICD-10-CM | POA: Diagnosis not present

## 2020-05-03 LAB — SARS CORONAVIRUS 2 (TAT 6-24 HRS): SARS Coronavirus 2: NEGATIVE

## 2020-05-03 NOTE — Discharge Instructions (Signed)

## 2020-05-05 ENCOUNTER — Encounter: Payer: Self-pay | Admitting: Ophthalmology

## 2020-05-05 ENCOUNTER — Ambulatory Visit
Admission: RE | Admit: 2020-05-05 | Discharge: 2020-05-05 | Disposition: A | Discharge status: Home / Self Care | Payer: Medicare PPO | Attending: Ophthalmology | Admitting: Ophthalmology

## 2020-05-05 ENCOUNTER — Ambulatory Visit: Payer: Medicare PPO | Admitting: Anesthesiology

## 2020-05-05 ENCOUNTER — Other Ambulatory Visit: Payer: Self-pay

## 2020-05-05 ENCOUNTER — Encounter
Admission: RE | Disposition: A | Discharge status: Home / Self Care | Payer: Self-pay | Source: Home / Self Care | Attending: Ophthalmology

## 2020-05-05 DIAGNOSIS — M1990 Unspecified osteoarthritis, unspecified site: Secondary | ICD-10-CM | POA: Insufficient documentation

## 2020-05-05 DIAGNOSIS — H2512 Age-related nuclear cataract, left eye: Secondary | ICD-10-CM | POA: Insufficient documentation

## 2020-05-05 DIAGNOSIS — Z888 Allergy status to other drugs, medicaments and biological substances status: Secondary | ICD-10-CM | POA: Insufficient documentation

## 2020-05-05 DIAGNOSIS — Z79899 Other long term (current) drug therapy: Secondary | ICD-10-CM | POA: Insufficient documentation

## 2020-05-05 DIAGNOSIS — H25812 Combined forms of age-related cataract, left eye: Secondary | ICD-10-CM | POA: Diagnosis not present

## 2020-05-05 DIAGNOSIS — Z9849 Cataract extraction status, unspecified eye: Secondary | ICD-10-CM | POA: Diagnosis not present

## 2020-05-05 DIAGNOSIS — F17200 Nicotine dependence, unspecified, uncomplicated: Secondary | ICD-10-CM | POA: Insufficient documentation

## 2020-05-05 HISTORY — PX: CATARACT EXTRACTION W/PHACO: SHX586

## 2020-05-05 SURGERY — PHACOEMULSIFICATION, CATARACT, WITH IOL INSERTION
Anesthesia: Monitor Anesthesia Care | Site: Eye | Laterality: Left

## 2020-05-05 MED ORDER — LACTATED RINGERS IV SOLN: INTRAVENOUS | Status: DC

## 2020-05-05 MED ORDER — ARMC OPHTHALMIC DILATING DROPS
1.0000 "application " | OPHTHALMIC | Status: DC | PRN
  Administered 2020-05-05 (×3): 1 via OPHTHALMIC

## 2020-05-05 MED ORDER — FENTANYL CITRATE (PF) 100 MCG/2ML IJ SOLN
INTRAMUSCULAR | Status: DC | PRN
  Administered 2020-05-05: 50 ug via INTRAVENOUS

## 2020-05-05 MED ORDER — BRIMONIDINE TARTRATE-TIMOLOL 0.2-0.5 % OP SOLN
OPHTHALMIC | Status: DC | PRN
  Administered 2020-05-05: 1 [drp] via OPHTHALMIC

## 2020-05-05 MED ORDER — TETRACAINE HCL 0.5 % OP SOLN
1.0000 [drp] | OPHTHALMIC | Status: DC | PRN
  Administered 2020-05-05 (×3): 1 [drp] via OPHTHALMIC

## 2020-05-05 MED ORDER — ACETAMINOPHEN 325 MG PO TABS: 325.0000 mg | ORAL_TABLET | Freq: Once | ORAL | Status: DC

## 2020-05-05 MED ORDER — LIDOCAINE HCL (PF) 2 % IJ SOLN
INTRAOCULAR | Status: DC | PRN
  Administered 2020-05-05: 1 mL

## 2020-05-05 MED ORDER — MOXIFLOXACIN HCL 0.5 % OP SOLN
1.0000 [drp] | OPHTHALMIC | Status: DC | PRN
  Administered 2020-05-05 (×3): 1 [drp] via OPHTHALMIC

## 2020-05-05 MED ORDER — NA HYALUR & NA CHOND-NA HYALUR 0.4-0.35 ML IO KIT
PACK | INTRAOCULAR | Status: DC | PRN
  Administered 2020-05-05: 1 mL via INTRAOCULAR

## 2020-05-05 MED ORDER — NEOMYCIN-POLYMYXIN-DEXAMETH 3.5-10000-0.1 OP OINT
TOPICAL_OINTMENT | OPHTHALMIC | Status: DC | PRN
  Administered 2020-05-05: 1 via OPHTHALMIC

## 2020-05-05 MED ORDER — CEFUROXIME OPHTHALMIC INJECTION 1 MG/0.1 ML
INJECTION | OPHTHALMIC | Status: DC | PRN
  Administered 2020-05-05: 0.1 mL via INTRACAMERAL

## 2020-05-05 MED ORDER — EPINEPHRINE PF 1 MG/ML IJ SOLN
INTRAOCULAR | Status: DC | PRN
  Administered 2020-05-05: 63 mL via OPHTHALMIC

## 2020-05-05 MED ORDER — MIDAZOLAM HCL 2 MG/2ML IJ SOLN
INTRAMUSCULAR | Status: DC | PRN
  Administered 2020-05-05: 1 mg via INTRAVENOUS

## 2020-05-05 MED ORDER — ACETAMINOPHEN 160 MG/5ML PO SOLN: 325.0000 mg | Freq: Once | ORAL | Status: DC

## 2020-05-05 SURGICAL SUPPLY — 23 items
CANNULA ANT/CHMB 27G (MISCELLANEOUS) ×1 IMPLANT
CANNULA ANT/CHMB 27GA (MISCELLANEOUS) ×3 IMPLANT
GLOVE SURG LX 7.5 STRW (GLOVE) ×2
GLOVE SURG LX STRL 7.5 STRW (GLOVE) ×1 IMPLANT
GLOVE SURG TRIUMPH 8.0 PF LTX (GLOVE) ×3 IMPLANT
GOWN STRL REUS W/ TWL LRG LVL3 (GOWN DISPOSABLE) ×2 IMPLANT
GOWN STRL REUS W/TWL LRG LVL3 (GOWN DISPOSABLE) ×6
LENS IOL ACRSF IQ ULTRA 19.0 (Intraocular Lens) IMPLANT
LENS IOL ACRYSOF IQ 19.0 (Intraocular Lens) ×3 IMPLANT
MARKER SKIN DUAL TIP RULER LAB (MISCELLANEOUS) ×3 IMPLANT
NDL CAPSULORHEX 25GA (NEEDLE) ×1 IMPLANT
NDL FILTER BLUNT 18X1 1/2 (NEEDLE) ×2 IMPLANT
NEEDLE CAPSULORHEX 25GA (NEEDLE) ×3 IMPLANT
NEEDLE FILTER BLUNT 18X 1/2SAF (NEEDLE) ×4
NEEDLE FILTER BLUNT 18X1 1/2 (NEEDLE) ×2 IMPLANT
PACK CATARACT BRASINGTON (MISCELLANEOUS) ×3 IMPLANT
PACK EYE AFTER SURG (MISCELLANEOUS) ×3 IMPLANT
PACK OPTHALMIC (MISCELLANEOUS) ×3 IMPLANT
SOLUTION OPHTHALMIC SALT (MISCELLANEOUS) ×3 IMPLANT
SYR 3ML LL SCALE MARK (SYRINGE) ×6 IMPLANT
SYR TB 1ML LUER SLIP (SYRINGE) ×3 IMPLANT
WATER STERILE IRR 250ML POUR (IV SOLUTION) ×3 IMPLANT
WIPE NON LINTING 3.25X3.25 (MISCELLANEOUS) ×3 IMPLANT

## 2020-05-05 NOTE — Anesthesia Procedure Notes (Signed)
Procedure Name: MAC Date/Time: 05/05/2020 9:27 AM Performed by: Silvana Newness, CRNA Pre-anesthesia Checklist: Patient identified, Emergency Drugs available, Suction available, Patient being monitored and Timeout performed Patient Re-evaluated:Patient Re-evaluated prior to induction Oxygen Delivery Method: Nasal cannula Placement Confirmation: positive ETCO2

## 2020-05-05 NOTE — H&P (Signed)

## 2020-05-05 NOTE — Anesthesia Preprocedure Evaluation (Addendum)
Anesthesia Evaluation  Patient identified by MRN, date of birth, ID band Patient awake    Reviewed: Allergy & Precautions, H&P , NPO status , Patient's Chart, lab work & pertinent test results  Airway Mallampati: II  TM Distance: >3 FB Neck ROM: full    Dental no notable dental hx.    Pulmonary Current Smoker and Patient abstained from smoking.,    Pulmonary exam normal breath sounds clear to auscultation       Cardiovascular Normal cardiovascular exam Rhythm:regular Rate:Normal     Neuro/Psych    GI/Hepatic   Endo/Other    Renal/GU      Musculoskeletal   Abdominal   Peds  Hematology   Anesthesia Other Findings   Reproductive/Obstetrics                             Anesthesia Physical Anesthesia Plan  ASA: II  Anesthesia Plan: MAC   Post-op Pain Management:    Induction:   PONV Risk Score and Plan: 0 and Treatment may vary due to age or medical condition, TIVA and Midazolam  Airway Management Planned:   Additional Equipment:   Intra-op Plan:   Post-operative Plan:   Informed Consent: I have reviewed the patients History and Physical, chart, labs and discussed the procedure including the risks, benefits and alternatives for the proposed anesthesia with the patient or authorized representative who has indicated his/her understanding and acceptance.     Dental Advisory Given  Plan Discussed with: CRNA  Anesthesia Plan Comments:        Anesthesia Quick Evaluation

## 2020-05-05 NOTE — Transfer of Care (Signed)
Immediate Anesthesia Transfer of Care Note  Patient: Michael Wiley  Procedure(s) Performed: CATARACT EXTRACTION PHACO AND INTRAOCULAR LENS PLACEMENT (IOC) LEFT 6.04 00:50.7 11.9% (Left Eye)  Patient Location: PACU  Anesthesia Type: MAC  Level of Consciousness: awake, alert  and patient cooperative  Airway and Oxygen Therapy: Patient Spontanous Breathing and Patient connected to supplemental oxygen  Post-op Assessment: Post-op Vital signs reviewed, Patient's Cardiovascular Status Stable, Respiratory Function Stable, Patent Airway and No signs of Nausea or vomiting  Post-op Vital Signs: Reviewed and stable  Complications: No complications documented.

## 2020-05-05 NOTE — Op Note (Signed)
OPERATIVE NOTE  Michael Wiley [id number] 05/05/2020   PREOPERATIVE DIAGNOSIS:  Nuclear sclerotic cataract left eye. H25.12   POSTOPERATIVE DIAGNOSIS:    Nuclear sclerotic cataract left eye.     PROCEDURE:  Phacoemusification with posterior chamber intraocular lens placement of the left eye  Ultrasound time: Procedure(s) with comments: CATARACT EXTRACTION PHACO AND INTRAOCULAR LENS PLACEMENT (IOC) LEFT 6.04 00:50.7 11.9% (Left) - prefers early arrival  LENS:   Implant Name Type Inv. Item Serial No. Manufacturer Lot No. LRB No. Used Action  LENS IOL ACRYSOF IQ 19.0 - [id number] Intraocular Lens LENS IOL ACRYSOF IQ 19.0 [id number] ALCON  Left 1 Implanted      SURGEON:  Wyonia Hough, MD   ANESTHESIA:  Topical with tetracaine drops and 2% Xylocaine jelly, augmented with 1% preservative-free intracameral lidocaine.    COMPLICATIONS:  None.   DESCRIPTION OF PROCEDURE:  The patient was identified in the holding room and transported to the operating room and placed in the supine position under the operating microscope.  The left eye was identified as the operative eye and it was prepped and draped in the usual sterile ophthalmic fashion.   A 1 millimeter clear-corneal paracentesis was made at the 1:30 position.  0.5 ml of preservative-free 1% lidocaine was injected into the anterior chamber.  The anterior chamber was filled with Viscoat viscoelastic.  A 2.4 millimeter keratome was used to make a near-clear corneal incision at the 10:30 position.  .  A curvilinear capsulorrhexis was made with a cystotome and capsulorrhexis forceps.  Balanced salt solution was used to hydrodissect and hydrodelineate the nucleus.   Phacoemulsification was then used in stop and chop fashion to remove the lens nucleus and epinucleus.  The remaining cortex was then removed using the irrigation and aspiration handpiece. Provisc was then placed into the capsular bag to distend it for lens placement.  A  lens was then injected into the capsular bag.  The remaining viscoelastic was aspirated.   Wounds were hydrated with balanced salt solution.  The anterior chamber was inflated to a physiologic pressure with balanced salt solution.  No wound leaks were noted. Cefuroxime 0.1 ml of a 10mg /ml solution was injected into the anterior chamber for a dose of 1 mg of intracameral antibiotic at the completion of the case.   Timolol and Brimonidine drops and Maxitrol ointment were applied to the eye.  The patient was taken to the recovery room in stable condition without complications of anesthesia or surgery.  Michael Wiley 05/05/2020, 9:43 AM

## 2020-05-05 NOTE — Anesthesia Postprocedure Evaluation (Signed)
Anesthesia Post Note  Patient: Michael Wiley  Procedure(s) Performed: CATARACT EXTRACTION PHACO AND INTRAOCULAR LENS PLACEMENT (IOC) LEFT 6.04 00:50.7 11.9% (Left Eye)     Patient location during evaluation: PACU Anesthesia Type: MAC Level of consciousness: awake and alert and oriented Pain management: satisfactory to patient Vital Signs Assessment: post-procedure vital signs reviewed and stable Respiratory status: spontaneous breathing, nonlabored ventilation and respiratory function stable Cardiovascular status: blood pressure returned to baseline and stable Postop Assessment: Adequate PO intake and No signs of nausea or vomiting Anesthetic complications: no   No complications documented.  Raliegh Ip

## 2020-05-06 ENCOUNTER — Encounter: Payer: Self-pay | Admitting: Ophthalmology

## 2020-05-14 DIAGNOSIS — Z8371 Family history of colonic polyps: Secondary | ICD-10-CM | POA: Diagnosis not present

## 2020-05-14 DIAGNOSIS — Z720 Tobacco use: Secondary | ICD-10-CM | POA: Diagnosis not present

## 2020-05-14 DIAGNOSIS — Z86010 Personal history of colonic polyps: Secondary | ICD-10-CM | POA: Diagnosis not present

## 2020-05-14 DIAGNOSIS — D696 Thrombocytopenia, unspecified: Secondary | ICD-10-CM | POA: Diagnosis not present

## 2020-06-18 DIAGNOSIS — Z01818 Encounter for other preprocedural examination: Secondary | ICD-10-CM | POA: Diagnosis not present

## 2020-06-22 DIAGNOSIS — K640 First degree hemorrhoids: Secondary | ICD-10-CM | POA: Diagnosis not present

## 2020-06-22 DIAGNOSIS — D120 Benign neoplasm of cecum: Secondary | ICD-10-CM | POA: Diagnosis not present

## 2020-06-22 DIAGNOSIS — Z86010 Personal history of colonic polyps: Secondary | ICD-10-CM | POA: Diagnosis not present

## 2020-06-22 DIAGNOSIS — K635 Polyp of colon: Secondary | ICD-10-CM | POA: Diagnosis not present

## 2020-06-22 DIAGNOSIS — K5730 Diverticulosis of large intestine without perforation or abscess without bleeding: Secondary | ICD-10-CM | POA: Diagnosis not present

## 2020-06-22 LAB — HM COLONOSCOPY

## 2020-08-23 ENCOUNTER — Ambulatory Visit (INDEPENDENT_AMBULATORY_CARE_PROVIDER_SITE_OTHER): Payer: Medicare Other

## 2020-08-23 VITALS — Ht 72.0 in | Wt 175.0 lb

## 2020-08-23 DIAGNOSIS — Z0000 Encounter for general adult medical examination without abnormal findings: Secondary | ICD-10-CM
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
## 2020-08-23 NOTE — Patient Instructions (Addendum)
Michael Wiley , Thank you for taking time to come for your Medicare Wellness Visit. I appreciate your ongoing commitment to your health goals. Please review the following plan we discussed and let me know if I can assist you in the future.   These are the goals we discussed: Goals      Patient Stated   .  Maintain Healthy Lifestyle (pt-stated)      Stay hydrated  Stay active Healthy diet Stop smoking       This is a list of the screening recommended for you and due dates:  Health Maintenance  Topic Date Due  .  Hepatitis C: One time screening is recommended by Center for Disease Control  (CDC) for  adults born from [birth year] through 1964.   Never done  . COVID-19 Vaccine (1) Never done  . Tetanus Vaccine  Never done  . Flu Shot  Completed  . Pneumonia vaccines  Completed    Immunizations Immunization History  Administered Date(s) Administered  . Fluad Quad(high Dose 65+) 04/04/2019, 04/19/2020  . Influenza Split 05/05/2012  . Influenza, High Dose Seasonal PF 08/13/2015, 05/24/2016, 05/17/2017, 04/29/2018  . Influenza,inj,Quad PF,6+ Mos 04/17/2014  . Pneumococcal Conjugate-13 08/13/2015  . Pneumococcal Polysaccharide-23 08/05/2012   Keep all routine maintenance appointments.   Follow up 08/26/20 @ 9:00  Advanced directives: not yet completed.   Conditions/risks identified: none new.  Follow up in one year for your annual wellness visit.   Preventive Care 51 Years and Older, Male Preventive care refers to lifestyle choices and visits with your health care provider that can promote health and wellness. What does preventive care include?  A yearly physical exam. This is also called an annual well check.  Dental exams once or twice a year.  Routine eye exams. Ask your health care provider how often you should have your eyes checked.  Personal lifestyle choices, including:  Daily care of your teeth and gums.  Regular physical activity.  Eating a healthy  diet.  Avoiding tobacco and drug use.  Limiting alcohol use.  Practicing safe sex.  Taking low doses of aspirin every day.  Taking vitamin and mineral supplements as recommended by your health care provider. What happens during an annual well check? The services and screenings done by your health care provider during your annual well check will depend on your age, overall health, lifestyle risk factors, and family history of disease. Counseling  Your health care provider may ask you questions about your:  Alcohol use.  Tobacco use.  Drug use.  Emotional well-being.  Home and relationship well-being.  Sexual activity.  Eating habits.  History of falls.  Memory and ability to understand (cognition).  Work and work Statistician. Screening  You may have the following tests or measurements:  Height, weight, and BMI.  Blood pressure.  Lipid and cholesterol levels. These may be checked every 5 years, or more frequently if you are over 19 years old.  Skin check.  Lung cancer screening. You may have this screening every year starting at age 55 if you have a 30-pack-year history of smoking and currently smoke or have quit within the past 15 years.  Fecal occult blood test (FOBT) of the stool. You may have this test every year starting at age 21.  Flexible sigmoidoscopy or colonoscopy. You may have a sigmoidoscopy every 5 years or a colonoscopy every 10 years starting at age 26.  Prostate cancer screening. Recommendations will vary depending on your family history and other  risks.  Hepatitis C blood test.  Hepatitis B blood test.  Sexually transmitted disease (STD) testing.  Diabetes screening. This is done by checking your blood sugar (glucose) after you have not eaten for a while (fasting). You may have this done every 1-3 years.  Abdominal aortic aneurysm (AAA) screening. You may need this if you are a current or former smoker.  Osteoporosis. You may be screened  starting at age 53 if you are at high risk. Talk with your health care provider about your test results, treatment options, and if necessary, the need for more tests. Vaccines  Your health care provider may recommend certain vaccines, such as:  Influenza vaccine. This is recommended every year.  Tetanus, diphtheria, and acellular pertussis (Tdap, Td) vaccine. You may need a Td booster every 10 years.  Zoster vaccine. You may need this after age 19.  Pneumococcal 13-valent conjugate (PCV13) vaccine. One dose is recommended after age 49.  Pneumococcal polysaccharide (PPSV23) vaccine. One dose is recommended after age 41. Talk to your health care provider about which screenings and vaccines you need and how often you need them. This information is not intended to replace advice given to you by your health care provider. Make sure you discuss any questions you have with your health care provider. Document Released: 07/30/2015 Document Revised: 03/22/2016 Document Reviewed: 05/04/2015 Elsevier Interactive Patient Education  2017 [place] Prevention in the Home Falls can cause injuries. They can happen to people of all ages. There are many things you can do to make your home safe and to help prevent falls. What can I do on the outside of my home?  Regularly fix the edges of walkways and driveways and fix any cracks.  Remove anything that might make you trip as you walk through a door, such as a raised step or threshold.  Trim any bushes or trees on the path to your home.  Use bright outdoor lighting.  Clear any walking paths of anything that might make someone trip, such as rocks or tools.  Regularly check to see if handrails are loose or broken. Make sure that both sides of any steps have handrails.  Any raised decks and porches should have guardrails on the edges.  Have any leaves, snow, or ice cleared regularly.  Use sand or salt on walking paths during  winter.  Clean up any spills in your garage right away. This includes oil or grease spills. What can I do in the bathroom?  Use night lights.  Install grab bars by the toilet and in the tub and shower. Do not use towel bars as grab bars.  Use non-skid mats or decals in the tub or shower.  If you need to sit down in the shower, use a plastic, non-slip stool.  Keep the floor dry. Clean up any water that spills on the floor as soon as it happens.  Remove soap buildup in the tub or shower regularly.  Attach bath mats securely with double-sided non-slip rug tape.  Do not have throw rugs and other things on the floor that can make you trip. What can I do in the bedroom?  Use night lights.  Make sure that you have a light by your bed that is easy to reach.  Do not use any sheets or blankets that are too big for your bed. They should not hang down onto the floor.  Have a firm chair that has side arms. You can use this for support  while you get dressed.  Do not have throw rugs and other things on the floor that can make you trip. What can I do in the kitchen?  Clean up any spills right away.  Avoid walking on wet floors.  Keep items that you use a lot in easy-to-reach places.  If you need to reach something above you, use a strong step stool that has a grab bar.  Keep electrical cords out of the way.  Do not use floor polish or wax that makes floors slippery. If you must use wax, use non-skid floor wax.  Do not have throw rugs and other things on the floor that can make you trip. What can I do with my stairs?  Do not leave any items on the stairs.  Make sure that there are handrails on both sides of the stairs and use them. Fix handrails that are broken or loose. Make sure that handrails are as long as the stairways.  Check any carpeting to make sure that it is firmly attached to the stairs. Fix any carpet that is loose or worn.  Avoid having throw rugs at the top or  bottom of the stairs. If you do have throw rugs, attach them to the floor with carpet tape.  Make sure that you have a light switch at the top of the stairs and the bottom of the stairs. If you do not have them, ask someone to add them for you. What else can I do to help prevent falls?  Wear shoes that:  Do not have high heels.  Have rubber bottoms.  Are comfortable and fit you well.  Are closed at the toe. Do not wear sandals.  If you use a stepladder:  Make sure that it is fully opened. Do not climb a closed stepladder.  Make sure that both sides of the stepladder are locked into place.  Ask someone to hold it for you, if possible.  Clearly mark and make sure that you can see:  Any grab bars or handrails.  First and last steps.  Where the edge of each step is.  Use tools that help you move around (mobility aids) if they are needed. These include:  Canes.  Walkers.  Scooters.  Crutches.  Turn on the lights when you go into a dark area. Replace any light bulbs as soon as they burn out.  Set up your furniture so you have a clear path. Avoid moving your furniture around.  If any of your floors are uneven, fix them.  If there are any pets around you, be aware of where they are.  Review your medicines with your doctor. Some medicines can make you feel dizzy. This can increase your chance of falling. Ask your doctor what other things that you can do to help prevent falls. This information is not intended to replace advice given to you by your health care provider. Make sure you discuss any questions you have with your health care provider. Document Released: 04/29/2009 Document Revised: 12/09/2015 Document Reviewed: 08/07/2014 Elsevier Interactive Patient Education  2017 [employer].

## 2020-08-24 ENCOUNTER — Telehealth: Payer: Self-pay | Admitting: Internal Medicine

## 2020-08-24 NOTE — Telephone Encounter (Signed)
Pt has had all 3 covid pfizer vaccines. Pt has appt Thurs with his pcp and will bring card to be scanned in chart.

## 2020-08-26 ENCOUNTER — Other Ambulatory Visit: Payer: Self-pay | Admitting: Internal Medicine

## 2020-08-26 ENCOUNTER — Other Ambulatory Visit: Payer: Self-pay

## 2020-08-26 ENCOUNTER — Ambulatory Visit (INDEPENDENT_AMBULATORY_CARE_PROVIDER_SITE_OTHER): Payer: Medicare Other | Admitting: Internal Medicine

## 2020-08-26 ENCOUNTER — Encounter: Payer: Self-pay | Admitting: Internal Medicine

## 2020-08-26 VITALS — BP 126/70 | HR 51 | Temp 97.8°F | Resp 16 | Ht 72.0 in | Wt 185.0 lb

## 2020-08-26 DIAGNOSIS — E7800 Pure hypercholesterolemia, unspecified: Secondary | ICD-10-CM

## 2020-08-26 DIAGNOSIS — Z720 Tobacco use: Secondary | ICD-10-CM

## 2020-08-26 DIAGNOSIS — Z86010 Personal history of colonic polyps: Secondary | ICD-10-CM

## 2020-08-26 DIAGNOSIS — M545 Low back pain, unspecified: Secondary | ICD-10-CM

## 2020-08-26 DIAGNOSIS — D696 Thrombocytopenia, unspecified: Secondary | ICD-10-CM

## 2020-08-26 LAB — LIPID PANEL
Cholesterol: 160 mg/dL (ref 0–200)
HDL: 32.4 mg/dL — ABNORMAL LOW (ref >39.00)
LDL Cholesterol: 102 mg/dL — ABNORMAL HIGH (ref 0–99)
NonHDL: 128.06
Total CHOL/HDL Ratio: 5
Triglycerides: 128 mg/dL (ref 0.0–149.0)
VLDL: 25.6 mg/dL (ref 0.0–40.0)

## 2020-08-26 LAB — HEPATIC FUNCTION PANEL
ALT: 9 U/L (ref 0–53)
AST: 14 U/L (ref 0–37)
Albumin: 4.3 g/dL (ref 3.5–5.2)
Alkaline Phosphatase: 44 U/L (ref 39–117)
Bilirubin, Direct: 0.1 mg/dL (ref 0.0–0.3)
Total Bilirubin: 0.6 mg/dL (ref 0.2–1.2)
Total Protein: 6.7 g/dL (ref 6.0–8.3)

## 2020-08-26 LAB — CBC WITH DIFFERENTIAL/PLATELET
Basophils Absolute: 0.1 10*3/uL (ref 0.0–0.1)
Basophils Relative: 1.3 % (ref 0.0–3.0)
Eosinophils Absolute: 0.2 10*3/uL (ref 0.0–0.7)
Eosinophils Relative: 3.8 % (ref 0.0–5.0)
HCT: 46.2 % (ref 39.0–52.0)
Hemoglobin: 15.6 g/dL (ref 13.0–17.0)
Lymphocytes Relative: 42.9 % (ref 12.0–46.0)
Lymphs Abs: 1.9 10*3/uL (ref 0.7–4.0)
MCHC: 33.8 g/dL (ref 30.0–36.0)
MCV: 96.1 fl (ref 78.0–100.0)
Monocytes Absolute: 0.3 10*3/uL (ref 0.1–1.0)
Monocytes Relative: 7.7 % (ref 3.0–12.0)
Neutro Abs: 2 10*3/uL (ref 1.4–7.7)
Neutrophils Relative %: 44.3 % (ref 43.0–77.0)
Platelets: 124 10*3/uL — ABNORMAL LOW (ref 150.0–400.0)
RBC: 4.81 Mil/uL (ref 4.22–5.81)
RDW: 14.5 % (ref 11.5–15.5)
WBC: 4.4 10*3/uL (ref 4.0–10.5)

## 2020-08-26 LAB — BASIC METABOLIC PANEL
BUN: 8 mg/dL (ref 6–23)
CO2: 31 mEq/L (ref 19–32)
Calcium: 9.6 mg/dL (ref 8.4–10.5)
Chloride: 104 mEq/L (ref 96–112)
Creatinine, Ser: 1.31 mg/dL (ref 0.40–1.50)
GFR: 52.05 mL/min — ABNORMAL LOW (ref >60.00)
Glucose, Bld: 82 mg/dL (ref 70–99)
Potassium: 4.6 mEq/L (ref 3.5–5.1)
Sodium: 141 mEq/L (ref 135–145)

## 2020-08-26 NOTE — Progress Notes (Signed)
Order placed for f/u lab appt.
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
## 2020-08-28 ENCOUNTER — Telehealth: Payer: Self-pay | Admitting: Internal Medicine

## 2020-08-28 ENCOUNTER — Encounter: Payer: Self-pay | Admitting: Internal Medicine

## 2020-08-28 NOTE — Telephone Encounter (Signed)
Need copy of colonoscopy (Dr Alice Reichert)  [facility] - (done around 4-12/2019).

## 2020-08-28 NOTE — Assessment & Plan Note (Signed)
Stable  Doing well

## 2020-08-28 NOTE — Assessment & Plan Note (Signed)
Discussed the need to quit smokig.  Discussed obtaining screening CT chest.  He declines CT. Declines to quit smoking.  Follow.

## 2020-08-28 NOTE — Assessment & Plan Note (Signed)
Apparently just had colonoscopy - Dr Alice Reichert.  Obtain copy of colonoscopy.

## 2020-08-28 NOTE — Assessment & Plan Note (Signed)
Recheck cbc today.   

## 2020-08-28 NOTE — Assessment & Plan Note (Signed)
Low cholesterol diet and exercise.  Have discussed calculated cholesterol risk.  Follow lipid panel.

## 2020-08-30 ENCOUNTER — Other Ambulatory Visit: Payer: Self-pay

## 2020-08-30 MED ORDER — ROSUVASTATIN CALCIUM 10 MG PO TABS
10.0000 mg | ORAL_TABLET | Freq: Every day | ORAL | 1 refills | Status: DC
Start: 2020-08-30 — End: 2022-04-11

## 2020-08-30 NOTE — Progress Notes (Unsigned)
cres

## 2020-08-30 NOTE — Telephone Encounter (Signed)
Requested from [place]

## 2020-10-11 ENCOUNTER — Other Ambulatory Visit: Payer: Self-pay

## 2020-10-11 ENCOUNTER — Other Ambulatory Visit (INDEPENDENT_AMBULATORY_CARE_PROVIDER_SITE_OTHER): Payer: Medicare Other

## 2020-10-11 DIAGNOSIS — D696 Thrombocytopenia, unspecified: Secondary | ICD-10-CM | POA: Diagnosis not present

## 2020-10-11 LAB — PLATELET COUNT: Platelets: 171 10*3/uL (ref 140–400)

## 2020-12-16 ENCOUNTER — Telehealth: Payer: Self-pay | Admitting: Internal Medicine

## 2020-12-16 DIAGNOSIS — I776 Arteritis, unspecified: Secondary | ICD-10-CM | POA: Diagnosis not present

## 2020-12-16 NOTE — Telephone Encounter (Signed)
Pt returned your call. He states that he can not answer because he is at the walk in clinic.

## 2020-12-16 NOTE — Telephone Encounter (Signed)
Patient appears to be at [facility] UC or one that's [place] affiliated.

## 2020-12-16 NOTE — Telephone Encounter (Signed)
Left message for patient to call back  

## 2020-12-16 NOTE — Telephone Encounter (Signed)
Transfer to Access Nurse   PT called to advise that he has a blood vessel on his thigh that is a size of a goofball. Advise no appts today and transfer to access nurse.

## 2020-12-28 ENCOUNTER — Emergency Department: Payer: Medicare Other

## 2020-12-28 ENCOUNTER — Telehealth: Payer: Self-pay | Admitting: Internal Medicine

## 2020-12-28 ENCOUNTER — Observation Stay
Admission: EM | Admit: 2020-12-28 | Discharge: 2020-12-29 | Disposition: A | Discharge status: Home / Self Care | Payer: Medicare Other | Attending: Internal Medicine | Admitting: Internal Medicine

## 2020-12-28 ENCOUNTER — Other Ambulatory Visit: Payer: Self-pay

## 2020-12-28 DIAGNOSIS — I80292 Phlebitis and thrombophlebitis of other deep vessels of left lower extremity: Secondary | ICD-10-CM | POA: Diagnosis present

## 2020-12-28 DIAGNOSIS — D696 Thrombocytopenia, unspecified: Secondary | ICD-10-CM | POA: Diagnosis present

## 2020-12-28 DIAGNOSIS — I803 Phlebitis and thrombophlebitis of lower extremities, unspecified: Secondary | ICD-10-CM | POA: Diagnosis not present

## 2020-12-28 DIAGNOSIS — I8002 Phlebitis and thrombophlebitis of superficial vessels of left lower extremity: Secondary | ICD-10-CM | POA: Diagnosis not present

## 2020-12-28 DIAGNOSIS — Z79899 Other long term (current) drug therapy: Secondary | ICD-10-CM | POA: Diagnosis not present

## 2020-12-28 DIAGNOSIS — R6510 Systemic inflammatory response syndrome (SIRS) of non-infectious origin without acute organ dysfunction: Secondary | ICD-10-CM | POA: Insufficient documentation

## 2020-12-28 DIAGNOSIS — M7989 Other specified soft tissue disorders: Secondary | ICD-10-CM | POA: Diagnosis not present

## 2020-12-28 DIAGNOSIS — F17210 Nicotine dependence, cigarettes, uncomplicated: Secondary | ICD-10-CM | POA: Diagnosis not present

## 2020-12-28 DIAGNOSIS — Z20822 Contact with and (suspected) exposure to covid-19: Secondary | ICD-10-CM | POA: Insufficient documentation

## 2020-12-28 DIAGNOSIS — I83812 Varicose veins of left lower extremities with pain: Secondary | ICD-10-CM | POA: Diagnosis not present

## 2020-12-28 DIAGNOSIS — I809 Phlebitis and thrombophlebitis of unspecified site: Secondary | ICD-10-CM

## 2020-12-28 DIAGNOSIS — M79662 Pain in left lower leg: Secondary | ICD-10-CM | POA: Diagnosis present

## 2020-12-28 LAB — PROTIME-INR
INR: 1 (ref 0.8–1.2)
Prothrombin Time: 13.5 seconds (ref 11.4–15.2)

## 2020-12-28 LAB — BASIC METABOLIC PANEL
Anion gap: 6 (ref 5–15)
BUN: 10 mg/dL (ref 8–23)
CO2: 29 mmol/L (ref 22–32)
Calcium: 9.2 mg/dL (ref 8.9–10.3)
Chloride: 104 mmol/L (ref 98–111)
Creatinine, Ser: 1.39 mg/dL — ABNORMAL HIGH (ref 0.61–1.24)
GFR, Estimated: 52 mL/min — ABNORMAL LOW (ref >60)
Glucose, Bld: 78 mg/dL (ref 70–99)
Potassium: 4.1 mmol/L (ref 3.5–5.1)
Sodium: 139 mmol/L (ref 135–145)

## 2020-12-28 LAB — APTT: aPTT: 28 seconds (ref 24–36)

## 2020-12-28 LAB — CBC
HCT: 42.3 % (ref 39.0–52.0)
Hemoglobin: 14.2 g/dL (ref 13.0–17.0)
MCH: 32.6 pg (ref 26.0–34.0)
MCHC: 33.6 g/dL (ref 30.0–36.0)
MCV: 97.2 fL (ref 80.0–100.0)
Platelets: 145 10*3/uL — ABNORMAL LOW (ref 150–400)
RBC: 4.35 MIL/uL (ref 4.22–5.81)
RDW: 14.2 % (ref 11.5–15.5)
WBC: 7.5 10*3/uL (ref 4.0–10.5)
nRBC: 0 % (ref 0.0–0.2)

## 2020-12-28 LAB — RESP PANEL BY RT-PCR (FLU A&B, COVID) ARPGX2
Influenza A by PCR: NEGATIVE
Influenza B by PCR: NEGATIVE
SARS Coronavirus 2 by RT PCR: NEGATIVE

## 2020-12-28 LAB — LACTIC ACID, PLASMA: Lactic Acid, Venous: 0.9 mmol/L (ref 0.5–1.9)

## 2020-12-28 MED ORDER — MORPHINE SULFATE (PF) 2 MG/ML IV SOLN: 2.0000 mg | INTRAVENOUS | Status: DC | PRN

## 2020-12-28 MED ORDER — SODIUM CHLORIDE 0.9 % IV BOLUS
500.0000 mL | Freq: Once | INTRAVENOUS | Status: AC
  Administered 2020-12-28: 20:00:00 500 mL via INTRAVENOUS

## 2020-12-28 MED ORDER — APIXABAN 5 MG PO TABS
5.0000 mg | ORAL_TABLET | ORAL | Status: AC
  Administered 2020-12-28: 20:00:00 5 mg via ORAL
  Filled 2020-12-28: qty 1

## 2020-12-28 MED ORDER — ONDANSETRON HCL 4 MG/2ML IJ SOLN: 4.0000 mg | Freq: Four times a day (QID) | INTRAMUSCULAR | Status: DC | PRN

## 2020-12-28 MED ORDER — HYDROCODONE-ACETAMINOPHEN 5-325 MG PO TABS: 1.0000 | ORAL_TABLET | ORAL | Status: DC | PRN

## 2020-12-28 MED ORDER — APIXABAN 5 MG PO TABS
5.0000 mg | ORAL_TABLET | Freq: Two times a day (BID) | ORAL | Status: DC
  Administered 2020-12-29: 5 mg via ORAL
  Filled 2020-12-28: qty 1

## 2020-12-28 MED ORDER — ACETAMINOPHEN 325 MG PO TABS: 650.0000 mg | ORAL_TABLET | Freq: Four times a day (QID) | ORAL | Status: DC | PRN

## 2020-12-28 MED ORDER — ONDANSETRON HCL 4 MG PO TABS: 4.0000 mg | ORAL_TABLET | Freq: Four times a day (QID) | ORAL | Status: DC | PRN

## 2020-12-28 MED ORDER — SODIUM CHLORIDE 0.9 % IV SOLN: 1.0000 g | INTRAVENOUS | Status: DC

## 2020-12-28 MED ORDER — ACETAMINOPHEN 650 MG RE SUPP: 650.0000 mg | Freq: Four times a day (QID) | RECTAL | Status: DC | PRN

## 2020-12-28 MED ORDER — SODIUM CHLORIDE 0.9 % IV SOLN
2.0000 g | Freq: Once | INTRAVENOUS | Status: AC
  Administered 2020-12-28: 20:00:00 2 g via INTRAVENOUS
  Filled 2020-12-28: qty 20

## 2020-12-28 NOTE — Telephone Encounter (Signed)
Per urgent care, was supposed to be scheduled an appt with vascular surgery.  If he is having redness and pain, I would like for him to go ahead and be evaluated today to confirm nothing more acute going on - clot, etc.  We can f/u after as well.

## 2020-12-28 NOTE — ED Notes (Signed)
See triage note  Presents from [facility] with pain and some swelling to left lower leg  Did have some redness couple of days ago

## 2020-12-28 NOTE — ED Notes (Signed)
Pt remains in [facility]
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
## 2020-12-28 NOTE — Telephone Encounter (Signed)
PT called into the office to see if he needs to go to the walk in clinic or if Dr.Scott wants to see him for his knees that are still giving him trouble.

## 2020-12-28 NOTE — Telephone Encounter (Signed)
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 Was given prednisone but says it did not help much. Leg is sore all the time from the knee down- feels like a throbbing pain. There is redness localized around the vein on his inner leg. Patient notes no swelling in his legs. He was wondering if he should schedule an appointment with you to look at it or if he needs to go back to UC. Patient denies any other acute symptoms. Stated they did not do any types of scans on his leg when he went to walk in. Just looked at it. I can schedule him with you tomorrow morning but he is aware he may have to go over to urgent care today.
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
## 2020-12-28 NOTE — Telephone Encounter (Signed)
Spoke with patient. He is going to UC to be evaluated this afternoon. He will follow up with [place] after.

## 2020-12-28 NOTE — ED Triage Notes (Signed)
Pt has left lower leg pain.  No known injury.  Pt reports red streak for 2-3 days.  Pain in calf of leg.  Pt alert   speech clear.

## 2020-12-29 DIAGNOSIS — D696 Thrombocytopenia, unspecified: Secondary | ICD-10-CM

## 2020-12-29 DIAGNOSIS — D692 Other nonthrombocytopenic purpura: Secondary | ICD-10-CM | POA: Diagnosis not present

## 2020-12-29 DIAGNOSIS — I8002 Phlebitis and thrombophlebitis of superficial vessels of left lower extremity: Secondary | ICD-10-CM

## 2020-12-29 DIAGNOSIS — R000 Tachycardia, unspecified: Secondary | ICD-10-CM

## 2020-12-29 DIAGNOSIS — I80292 Phlebitis and thrombophlebitis of other deep vessels of left lower extremity: Secondary | ICD-10-CM | POA: Diagnosis not present

## 2020-12-29 LAB — PROCALCITONIN: Procalcitonin: <0.1 ng/mL

## 2020-12-29 MED ORDER — CEPHALEXIN 500 MG PO CAPS: 500.0000 mg | ORAL_CAPSULE | Freq: Two times a day (BID) | ORAL | 0 refills | Status: AC

## 2020-12-29 MED ORDER — ACETAMINOPHEN 325 MG PO TABS: 650.0000 mg | ORAL_TABLET | Freq: Four times a day (QID) | ORAL | Status: DC | PRN

## 2020-12-29 MED ORDER — APIXABAN 5 MG PO TABS: 5.0000 mg | ORAL_TABLET | Freq: Two times a day (BID) | ORAL | 0 refills | Status: DC

## 2020-12-29 NOTE — Discharge Summary (Signed)
Physician Discharge Summary  Michael Wiley WER:[id number] DOB: Nov 16, 1940 DOA: 12/28/2020  PCP: Einar Pheasant, MD  Admit date: 12/28/2020 Discharge date: 12/29/2020  Admitted From: Home Disposition: Home   Recommendations for Outpatient Follow-up:  Follow up with vascular surgery in 3 to 4 weeks  Home Health:NO Equipment/Devices:None  Discharge Condition:stable CODE STATUS FULL Diet recommendation: Heart Healthy   Brief/Interim Summary:    Superficial thrombophlebitis of left leg vs septic thrombophlebitis -Sepsis ruled out - Lower extremity venous Doppler showing thrombosed varicosities within the left lower extremity at the site of pain and redness with no DVT - Patient with low-grade temperature of 99.8, mild tachycardia and hypotension.  Procalcitonin less than 0.1 -Vascular surgery input greatly appreciated, patient is currently afebrile, with no leukocytosis, pain has significantly improved, recommendation to discharge on Keflex, and 1 month on Eliquis 5 mg p.o. twice daily, and he will follow with vascular surgery as an outpatient and follow-up to repeat duplex at that time    Mild thrombocytopenia ([place]) - Stable  CKD stage III A -Renal function at baseline  Discharge Diagnoses:  Active Problems:   Thrombocytopenia (HCC)   Thrombophlebitis of left leg (HCC)   SIRS (systemic inflammatory response syndrome) (HCC)    Discharge Instructions  Discharge Instructions     Diet - low sodium heart healthy   Complete by: As directed    Discharge instructions   Complete by: As directed    ollow with Primary MD Einar Pheasant, MD in 7 days   Get CBC, CMP,  checked  by Primary MD next visit.    Activity: As tolerated with Full fall precautions use walker/cane & assistance as needed   Disposition Home    Diet: Heart Healthy .   On your next visit with your primary care physician please Get Medicines reviewed and adjusted.   Please request your Prim.MD to  go over all Hospital Tests and Procedure/Radiological results at the follow up, please get all Hospital records sent to your Prim MD by signing hospital release before you go home.   If you experience worsening of your admission symptoms, develop shortness of breath, life threatening emergency, suicidal or homicidal thoughts you must seek medical attention immediately by calling 911 or calling your MD immediately  if symptoms less severe.  You Must read complete instructions/literature along with all the possible adverse reactions/side effects for all the Medicines you take and that have been prescribed to you. Take any new Medicines after you have completely understood and accpet all the possible adverse reactions/side effects.   Do not drive, operating heavy machinery, perform activities at heights, swimming or participation in water activities or provide baby sitting services if your were admitted for syncope or siezures until you have seen by Primary MD or a Neurologist and advised to do so again.  Do not drive when taking Pain medications.    Do not take more than prescribed Pain, Sleep and Anxiety Medications  Special Instructions: If you have smoked or chewed Tobacco  in the last 2 yrs please stop smoking, stop any regular Alcohol  and or any Recreational drug use.  Wear Seat belts while driving.   Please note  You were cared for by a hospitalist during your hospital stay. If you have any questions about your discharge medications or the care you received while you were in the hospital after you are discharged, you can call the unit and asked to speak with the hospitalist on call if the hospitalist that took  care of you is not available. Once you are discharged, your primary care physician will handle any further medical issues. Please note that NO REFILLS for any discharge medications will be authorized once you are discharged, as it is imperative that you return to your primary care  physician (or establish a relationship with a primary care physician if you do not have one) for your aftercare needs so that they can reassess your need f   Increase activity slowly   Complete by: As directed       Allergies as of 12/29/2020       Reactions   Zyrtec [cetirizine] Other (See Comments)   Headache        Medication List     STOP taking these medications    Durezol 0.05 % Emul Generic drug: Difluprednate   predniSONE 10 MG (21) Tbpk tablet Commonly known as: STERAPRED UNI-PAK 21 TAB       TAKE these medications    acetaminophen 325 MG tablet Commonly known as: TYLENOL Take 2 tablets (650 mg total) by mouth every 6 (six) hours as needed for mild pain (or Fever >/= 101).   apixaban 5 MG Tabs tablet Commonly known as: ELIQUIS Take 1 tablet (5 mg total) by mouth 2 (two) times daily.   CALCIUM 600+D3 PO Take by mouth daily. Reported on 08/13/2015   cephALEXin 500 MG capsule Commonly known as: KEFLEX Take 1 capsule (500 mg total) by mouth 2 (two) times daily for 5 days.   ergocalciferol 1.25 MG (50000 UT) capsule Commonly known as: VITAMIN D2 Take 1 capsule (50,000 Units total) by mouth once a week.   pseudoephedrine-guaifenesin 60-600 MG 12 hr tablet Commonly known as: MUCINEX D Take 1 tablet by mouth every 12 (twelve) hours as needed for congestion.   rosuvastatin 10 MG tablet Commonly known as: Crestor Take 1 tablet (10 mg total) by mouth daily.        Follow-up Information     Kris Hartmann, NP Follow up in 4 week(s).   Specialty: Vascular Surgery Why: with left leg DVT study Contact information: [place] [address] [phone]                Allergies  Allergen Reactions   Zyrtec [Cetirizine] Other (See Comments)    Headache    Consultations: Vascular surgery   Procedures/Studies: US Venous Img Lower Unilateral Left  Result Date: 12/28/2020 CLINICAL DATA:  Left lower extremity pain with redness.  EXAM: LEFT LOWER EXTREMITY VENOUS DOPPLER ULTRASOUND TECHNIQUE: Gray-scale sonography with compression, as well as color and duplex ultrasound, were performed to evaluate the deep venous system(s) from the level of the common femoral vein through the popliteal and proximal calf veins. COMPARISON:  None. FINDINGS: VENOUS Normal compressibility of the common femoral, superficial femoral, and popliteal veins, as well as the visualized calf veins. Visualized portions of profunda femoral vein and great saphenous vein unremarkable. No filling defects to suggest DVT on grayscale or color Doppler imaging. Doppler waveforms show normal direction of venous flow, normal respiratory plasticity and response to augmentation. Limited views of the contralateral common femoral vein are unremarkable. OTHER Within the area of redness and swelling, there are thrombosed varicosities. Limitations: none IMPRESSION: 1. No evidence of left lower extremity deep venous thrombosis. 2. 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 Electronically Signed   By: Abigail Miyamoto M.D.   On: 12/28/2020 18:43      Subjective:  Discharge Exam: Vitals:   12/29/20 1200 12/29/20 1400  BP: 113/69   Pulse: (!) 45 (!) 103  Resp:    Temp:    SpO2: 96% 93%   Vitals:   12/29/20 0438 12/29/20 0844 12/29/20 1200 12/29/20 1400  BP: 129/77 115/75 113/69   Pulse: (!) 52 60 (!) 45 (!) 103  Resp: 18 18    Temp: 98.8 F (37.1 C) (!) 97.5 F (36.4 C)    TempSrc: Oral Oral    SpO2: 95% 97% 96% 93%  Weight:      Height:        General: Pt is alert, awake, not in acute distress Cardiovascular: RRR, S1/S2 +, no rubs, no gallops Respiratory: CTA bilaterally, no wheezing, no rhonchi Abdominal: Soft, NT, ND, bowel sounds + Extremities: no edema, no cyanosis, mild area of superficial thrombophlebitis in left lower extremity    The results of significant diagnostics from this hospitalization  (including imaging, microbiology, ancillary and laboratory) are listed below for reference.     Microbiology: Recent Results (from the past 240 hour(s))  Culture, blood (Routine X 2) w Reflex to ID Panel     Status: None (Preliminary result)   Collection Time: 12/28/20  7:21 PM   Specimen: BLOOD  Result Value Ref Range Status   Specimen Description BLOOD LEFT ANTECUBITAL  Final   Special Requests   Final    BOTTLES DRAWN AEROBIC AND ANAEROBIC Blood Culture adequate volume   Culture   Final    NO GROWTH < 12 HOURS Performed at [facility], [address], [place], [place] [address]    Report Status PENDING  Incomplete  Culture, blood (Routine X 2) w Reflex to ID Panel     Status: None (Preliminary result)   Collection Time: 12/28/20  7:21 PM   Specimen: BLOOD  Result Value Ref Range Status   Specimen Description BLOOD RH  Final   Special Requests   Final    BOTTLES DRAWN AEROBIC AND ANAEROBIC Blood Culture results may not be optimal due to an excessive volume of blood received in culture bottles   Culture   Final    NO GROWTH < 12 HOURS Performed at [facility], [address] [place], [place] [address]    Report Status PENDING  Incomplete  Resp Panel by RT-PCR (Flu A&B, Covid) Nasopharyngeal Swab     Status: None   Collection Time: 12/28/20  7:22 PM   Specimen: Nasopharyngeal Swab; Nasopharyngeal(NP) swabs in vial transport medium  Result Value Ref Range Status   SARS Coronavirus 2 by RT PCR NEGATIVE NEGATIVE Final    Comment: (NOTE) SARS-CoV-2 target nucleic acids are NOT DETECTED.  The SARS-CoV-2 RNA is generally detectable in upper respiratory specimens during the acute phase of infection. The lowest concentration of SARS-CoV-2 viral copies this assay can detect is 138 copies/mL. A negative result does not preclude SARS-Cov-2 infection and should not be used as the sole basis for treatment or other patient management decisions. A negative result may  occur with  improper specimen collection/handling, submission of specimen other than nasopharyngeal swab, presence of viral mutation(s) within the areas targeted by this assay, and inadequate number of viral copies(<138 copies/mL). A negative result must be combined with clinical observations, patient history, and epidemiological information. The expected result is Negative.  Fact Sheet for Patients:  EntrepreneurPulse.com.au  Fact Sheet for Healthcare Providers:  IncredibleEmployment.be  This test is no t yet approved or cleared by the [place] FDA  and  has been authorized for detection and/or diagnosis of SARS-CoV-2 by FDA under an Emergency Use Authorization (EUA). This EUA will remain  in effect (meaning this test can be used) for the duration of the COVID-19 declaration under Section 564(b)(1) of the Act, 21 U.S.C.section 360bbb-3(b)(1), unless the authorization is terminated  or revoked sooner.       Influenza A by PCR NEGATIVE NEGATIVE Final   Influenza B by PCR NEGATIVE NEGATIVE Final    Comment: (NOTE) The Xpert Xpress SARS-CoV-2/FLU/RSV plus assay is intended as an aid in the diagnosis of influenza from Nasopharyngeal swab specimens and should not be used as a sole basis for treatment. Nasal washings and aspirates are unacceptable for Xpert Xpress SARS-CoV-2/FLU/RSV testing.  Fact Sheet for Patients: EntrepreneurPulse.com.au  Fact Sheet for Healthcare Providers: IncredibleEmployment.be  This test is not yet approved or cleared by the [place] FDA and has been authorized for detection and/or diagnosis of SARS-CoV-2 by FDA under an Emergency Use Authorization (EUA). This EUA will remain in effect (meaning this test can be used) for the duration of the COVID-19 declaration under Section 564(b)(1) of the Act, 21 U.S.C. section 360bbb-3(b)(1), unless the authorization is terminated  or revoked.  Performed at [facility], [address] [place], [place] [address]      Labs: BNP (last 3 results) No results for input(s): BNP in the last 8760 hours. Basic Metabolic Panel: Recent Labs  Lab 12/28/20 1740  NA 139  K 4.1  CL 104  CO2 29  GLUCOSE 78  BUN 10  CREATININE 1.39*  CALCIUM 9.2   Liver Function Tests: No results for input(s): AST, ALT, ALKPHOS, BILITOT, PROT, ALBUMIN in the last 168 hours. No results for input(s): LIPASE, AMYLASE in the last 168 hours. No results for input(s): AMMONIA in the last 168 hours. CBC: Recent Labs  Lab 12/28/20 1740  WBC 7.5  HGB 14.2  HCT 42.3  MCV 97.2  PLT 145*   Cardiac Enzymes: No results for input(s): CKTOTAL, CKMB, CKMBINDEX, TROPONINI in the last 168 hours. BNP: Invalid input(s): POCBNP CBG: No results for input(s): GLUCAP in the last 168 hours. D-Dimer No results for input(s): DDIMER in the last 72 hours. Hgb A1c No results for input(s): HGBA1C in the last 72 hours. Lipid Profile No results for input(s): CHOL, HDL, LDLCALC, TRIG, CHOLHDL, LDLDIRECT in the last 72 hours. Thyroid function studies No results for input(s): TSH, T4TOTAL, T3FREE, THYROIDAB in the last 72 hours.  Invalid input(s): FREET3 Anemia work up No results for input(s): VITAMINB12, FOLATE, FERRITIN, TIBC, IRON, RETICCTPCT in the last 72 hours. Urinalysis    Component Value Date/Time   COLORURINE YELLOW 04/19/2020 1109   APPEARANCEUR CLEAR 04/19/2020 1109   LABSPEC 1.025 04/19/2020 1109   PHURINE 5.5 04/19/2020 1109   GLUCOSEU NEGATIVE 04/19/2020 1109   HGBUR NEGATIVE 04/19/2020 1109   [place] 04/19/2020 1109   BILIRUBINUR negative 12/29/2013 1633   KETONESUR NEGATIVE 04/19/2020 1109   PROTEINUR negative 12/29/2013 1633   UROBILINOGEN 0.2 04/19/2020 1109   NITRITE NEGATIVE 04/19/2020 1109   LEUKOCYTESUR NEGATIVE 04/19/2020 1109   Sepsis Labs Invalid input(s): PROCALCITONIN,  WBC,   LACTICIDVEN Microbiology Recent Results (from the past 240 hour(s))  Culture, blood (Routine X 2) w Reflex to ID Panel     Status: None (Preliminary result)   Collection Time: 12/28/20  7:21 PM   Specimen: BLOOD  Result Value Ref Range Status   Specimen Description BLOOD LEFT ANTECUBITAL  Final  Special Requests   Final    BOTTLES DRAWN AEROBIC AND ANAEROBIC Blood Culture adequate volume   Culture   Final    NO GROWTH < 12 HOURS Performed at [facility], [address] [place], [place] [address]    Report Status PENDING  Incomplete  Culture, blood (Routine X 2) w Reflex to ID Panel     Status: None (Preliminary result)   Collection Time: 12/28/20  7:21 PM   Specimen: BLOOD  Result Value Ref Range Status   Specimen Description BLOOD RH  Final   Special Requests   Final    BOTTLES DRAWN AEROBIC AND ANAEROBIC Blood Culture results may not be optimal due to an excessive volume of blood received in culture bottles   Culture   Final    NO GROWTH < 12 HOURS Performed at [facility], [address] [place], [place] [address]    Report Status PENDING  Incomplete  Resp Panel by RT-PCR (Flu A&B, Covid) Nasopharyngeal Swab     Status: None   Collection Time: 12/28/20  7:22 PM   Specimen: Nasopharyngeal Swab; Nasopharyngeal(NP) swabs in vial transport medium  Result Value Ref Range Status   SARS Coronavirus 2 by RT PCR NEGATIVE NEGATIVE Final    Comment: (NOTE) SARS-CoV-2 target nucleic acids are NOT DETECTED.  The SARS-CoV-2 RNA is generally detectable in upper respiratory specimens during the acute phase of infection. The lowest concentration of SARS-CoV-2 viral copies this assay can detect is 138 copies/mL. A negative result does not preclude SARS-Cov-2 infection and should not be used as the sole basis for treatment or other patient management decisions. A negative result may occur with  improper specimen collection/handling, submission of specimen other than  nasopharyngeal swab, presence of viral mutation(s) within the areas targeted by this assay, and inadequate number of viral copies(<138 copies/mL). A negative result must be combined with clinical observations, patient history, and epidemiological information. The expected result is Negative.  Fact Sheet for Patients:  EntrepreneurPulse.com.au  Fact Sheet for Healthcare Providers:  IncredibleEmployment.be  This test is no t yet approved or cleared by the [place] FDA and  has been authorized for detection and/or diagnosis of SARS-CoV-2 by FDA under an Emergency Use Authorization (EUA). This EUA will remain  in effect (meaning this test can be used) for the duration of the COVID-19 declaration under Section 564(b)(1) of the Act, 21 U.S.C.section 360bbb-3(b)(1), unless the authorization is terminated  or revoked sooner.       Influenza A by PCR NEGATIVE NEGATIVE Final   Influenza B by PCR NEGATIVE NEGATIVE Final    Comment: (NOTE) The Xpert Xpress SARS-CoV-2/FLU/RSV plus assay is intended as an aid in the diagnosis of influenza from Nasopharyngeal swab specimens and should not be used as a sole basis for treatment. Nasal washings and aspirates are unacceptable for Xpert Xpress SARS-CoV-2/FLU/RSV testing.  Fact Sheet for Patients: EntrepreneurPulse.com.au  Fact Sheet for Healthcare Providers: IncredibleEmployment.be  This test is not yet approved or cleared by the [place] FDA and has been authorized for detection and/or diagnosis of SARS-CoV-2 by FDA under an Emergency Use Authorization (EUA). This EUA will remain in effect (meaning this test can be used) for the duration of the COVID-19 declaration under Section 564(b)(1) of the Act, 21 U.S.C. section 360bbb-3(b)(1), unless the authorization is terminated or revoked.  Performed at [facility], [address], [place], [place]  [address]      Time coordinating discharge: Over 30 minutes  SIGNED:   Phillips Climes, MD  [facility] 12/29/2020, 3:20 PM Pager   If 7PM-7AM, please contact night-coverage [email or web address] Password [email or web address]
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
## 2020-12-29 NOTE — ED Notes (Signed)
Pt sitting up in bed with no complaints. Alert and oriented. Call light in reach.

## 2020-12-29 NOTE — ED Notes (Signed)
Pt walked to bathroom in hall with no problems. Coffee given per pt request.

## 2020-12-29 NOTE — ED Notes (Signed)
Patient ambulatory to lobby to wait for his ride via POV. Patient alert, oriented x4. Respirations even and unlabored. NADN.

## 2020-12-29 NOTE — Care Management CC44 (Signed)
Condition Code 44 Documentation Completed  Patient Details  Name: Michael Wiley MRN: [id number] Date of Birth: 17-Nov-1940   Condition Code 44 given:  Yes Patient signature on Condition Code 44 notice:  Yes Documentation of 2 MD's agreement:  Yes Code 44 added to claim:  Yes    Adelene Amas, [place] 12/29/2020, 3:32 PM

## 2020-12-29 NOTE — Telephone Encounter (Signed)
Per chart, patient has been admitted to [facility]

## 2020-12-29 NOTE — ED Notes (Signed)
Dr Elgergawy at bedside °

## 2020-12-29 NOTE — Care Management Obs Status (Signed)
[place] NOTIFICATION   Patient Details  Name: Michael Wiley MRN: [id number] Date of Birth: 11-14-1940   Medicare Observation Status Notification Given:       Ova Freshwater 12/29/2020, 3:32 PM

## 2020-12-29 NOTE — ED Notes (Signed)
Sarah, case management at bedside to assist with discharge of patient. Code 44 form provided to patient by Judson Roch, CM.

## 2020-12-29 NOTE — Discharge Instructions (Signed)
Follow with Primary MD Scott, Charlene, MD in 7 days   Get CBC, CMP, checked  by Primary MD next visit.    Activity: As tolerated with Full fall precautions use walker/cane & assistance as needed   Disposition Home   Diet: Heart Healthy   On your next visit with your primary care physician please Get Medicines reviewed and adjusted.   Please request your Prim.MD to go over all Hospital Tests and Procedure/Radiological results at the follow up, please get all Hospital records sent to your Prim MD by signing hospital release before you go home.   If you experience worsening of your admission symptoms, develop shortness of breath, life threatening emergency, suicidal or homicidal thoughts you must seek medical attention immediately by calling 911 or calling your MD immediately  if symptoms less severe.  You Must read complete instructions/literature along with all the possible adverse reactions/side effects for all the Medicines you take and that have been prescribed to you. Take any new Medicines after you have completely understood and accpet all the possible adverse reactions/side effects.   Do not drive, operating heavy machinery, perform activities at heights, swimming or participation in water activities or provide baby sitting services if your were admitted for syncope or siezures until you have seen by Primary MD or a Neurologist and advised to do so again.  Do not drive when taking Pain medications.    Do not take more than prescribed Pain, Sleep and Anxiety Medications  Special Instructions: If you have smoked or chewed Tobacco  in the last 2 yrs please stop smoking, stop any regular Alcohol  and or any Recreational drug use.  Wear Seat belts while driving.   Please note  You were cared for by a hospitalist during your hospital stay. If you have any questions about your discharge medications or the care you received while you were in the hospital after you are discharged,  you can call the unit and asked to speak with the hospitalist on call if the hospitalist that took care of you is not available. Once you are discharged, your primary care physician will handle any further medical issues. Please note that NO REFILLS for any discharge medications will be authorized once you are discharged, as it is imperative that you return to your primary care physician (or establish a relationship with a primary care physician if you do not have one) for your aftercare needs so that they can reassess your need for medications and monitor your lab values.  

## 2021-01-02 LAB — CULTURE, BLOOD (ROUTINE X 2)
Culture: NO GROWTH
Culture: NO GROWTH
Special Requests: ADEQUATE

## 2021-01-25 ENCOUNTER — Other Ambulatory Visit (INDEPENDENT_AMBULATORY_CARE_PROVIDER_SITE_OTHER): Payer: Self-pay | Admitting: Vascular Surgery

## 2021-01-25 DIAGNOSIS — I8002 Phlebitis and thrombophlebitis of superficial vessels of left lower extremity: Secondary | ICD-10-CM

## 2021-01-26 ENCOUNTER — Other Ambulatory Visit: Payer: Self-pay

## 2021-01-26 ENCOUNTER — Ambulatory Visit (INDEPENDENT_AMBULATORY_CARE_PROVIDER_SITE_OTHER): Payer: Medicare Other

## 2021-01-26 ENCOUNTER — Other Ambulatory Visit (INDEPENDENT_AMBULATORY_CARE_PROVIDER_SITE_OTHER): Payer: Self-pay | Admitting: Vascular Surgery

## 2021-01-26 ENCOUNTER — Ambulatory Visit (INDEPENDENT_AMBULATORY_CARE_PROVIDER_SITE_OTHER): Payer: Medicare Other | Admitting: Nurse Practitioner

## 2021-01-26 VITALS — BP 138/89 | HR 54 | Resp 16 | Wt 179.0 lb

## 2021-01-26 DIAGNOSIS — Z720 Tobacco use: Secondary | ICD-10-CM

## 2021-01-26 DIAGNOSIS — E7800 Pure hypercholesterolemia, unspecified: Secondary | ICD-10-CM

## 2021-01-26 DIAGNOSIS — I8002 Phlebitis and thrombophlebitis of superficial vessels of left lower extremity: Secondary | ICD-10-CM

## 2021-01-26 DIAGNOSIS — I80292 Phlebitis and thrombophlebitis of other deep vessels of left lower extremity: Secondary | ICD-10-CM

## 2021-01-30 ENCOUNTER — Encounter (INDEPENDENT_AMBULATORY_CARE_PROVIDER_SITE_OTHER): Payer: Self-pay | Admitting: Nurse Practitioner
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
## 2021-02-24 ENCOUNTER — Encounter: Payer: Medicare Other | Admitting: Internal Medicine

## 2021-03-04 ENCOUNTER — Other Ambulatory Visit: Payer: Self-pay

## 2021-03-04 ENCOUNTER — Encounter: Payer: Self-pay | Admitting: Internal Medicine

## 2021-03-04 ENCOUNTER — Ambulatory Visit (INDEPENDENT_AMBULATORY_CARE_PROVIDER_SITE_OTHER): Payer: Medicare Other | Admitting: Internal Medicine

## 2021-03-04 VITALS — BP 102/70 | HR 65 | Temp 97.9°F | Resp 16 | Ht 72.0 in | Wt 176.0 lb

## 2021-03-04 DIAGNOSIS — I8392 Asymptomatic varicose veins of left lower extremity: Secondary | ICD-10-CM | POA: Diagnosis not present

## 2021-03-04 DIAGNOSIS — D696 Thrombocytopenia, unspecified: Secondary | ICD-10-CM

## 2021-03-04 DIAGNOSIS — Z0000 Encounter for general adult medical examination without abnormal findings: Secondary | ICD-10-CM

## 2021-03-04 DIAGNOSIS — Z720 Tobacco use: Secondary | ICD-10-CM

## 2021-03-04 DIAGNOSIS — N1831 Chronic kidney disease, stage 3a: Secondary | ICD-10-CM

## 2021-03-04 DIAGNOSIS — M545 Low back pain, unspecified: Secondary | ICD-10-CM

## 2021-03-04 DIAGNOSIS — Z1159 Encounter for screening for other viral diseases: Secondary | ICD-10-CM

## 2021-03-04 DIAGNOSIS — E7800 Pure hypercholesterolemia, unspecified: Secondary | ICD-10-CM | POA: Diagnosis not present

## 2021-03-04 DIAGNOSIS — Z125 Encounter for screening for malignant neoplasm of prostate: Secondary | ICD-10-CM
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
## 2021-03-04 NOTE — Assessment & Plan Note (Addendum)
Physical today 03/04/21.   PSA 02/24/20 - 1.79.  Recheck with next labs.   Colonoscopy 06/22/20 - non bleeding internal hemorrhoids, two 3-5 mm polyps in the ascending colon and in the cecum (tubular adenoma), diverticulosis.  Repeat colonoscopy in 5 years.

## 2021-03-06 ENCOUNTER — Encounter: Payer: Self-pay | Admitting: Internal Medicine

## 2021-03-06 DIAGNOSIS — N183 Chronic kidney disease, stage 3 unspecified: Secondary | ICD-10-CM | POA: Insufficient documentation

## 2021-03-06 NOTE — Assessment & Plan Note (Signed)
Compression hose.  Leg elevation.  Just saw vascular surgery.  Desires no further invention.

## 2021-03-06 NOTE — Assessment & Plan Note (Signed)
Continue Crestor.  Low-cholesterol diet and exercise.  Follow lipid panel liver function tests. 

## 2021-03-06 NOTE — Assessment & Plan Note (Signed)
Stable  Doing well

## 2021-03-06 NOTE — Assessment & Plan Note (Signed)
Follow CBC. 

## 2021-03-06 NOTE — Assessment & Plan Note (Signed)
Chronic and stable Recheck metabolic panel Avoid nephrotoxic meds  

## 2021-03-06 NOTE — Assessment & Plan Note (Signed)
Have discussed the need to quit smoking.  Have discussed obtaining screening CT chest.  He has declined.  

## 2021-08-24 ENCOUNTER — Ambulatory Visit (INDEPENDENT_AMBULATORY_CARE_PROVIDER_SITE_OTHER): Payer: Medicare PPO

## 2021-08-24 VITALS — Temp 98.3°F | Ht 72.0 in | Wt 176.0 lb

## 2021-08-24 DIAGNOSIS — Z0000 Encounter for general adult medical examination without abnormal findings: Secondary | ICD-10-CM | POA: Diagnosis not present

## 2021-08-24 NOTE — Patient Instructions (Addendum)
°  Michael Wiley , Thank you for taking time to come for your Medicare Wellness Visit. I appreciate your ongoing commitment to your health goals. Please review the following plan we discussed and let me know if I can assist you in the future.   These are the goals we discussed:  Goals       Patient Stated     Maintain Healthy Lifestyle (pt-stated)      Stay active Healthy diet Stop smoking        This is a list of the screening recommended for you and due dates:  Health Maintenance  Topic Date Due   COVID-19 Vaccine (4 - Booster for Pfizer series) 09/09/2021*   Flu Shot  10/14/2021*   Zoster (Shingles) Vaccine (1 of 2) 11/21/2021*   Tetanus Vaccine  08/24/2022*   Hepatitis C Screening: USPSTF Recommendation to screen - Ages 18-79 yo.  08/24/2022*   Pneumonia Vaccine  Completed   HPV Vaccine  Aged Out  *Topic was postponed. The date shown is not the original due date.
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
## 2021-09-05 ENCOUNTER — Telehealth: Payer: Self-pay | Admitting: Internal Medicine

## 2021-09-05 NOTE — Telephone Encounter (Signed)
Left message asking the patient to schedule follow up visit with his PCP. Patient should have been scheduled for a 6 month follow up due February 2023.

## 2021-10-03 ENCOUNTER — Encounter: Payer: Self-pay | Admitting: Internal Medicine

## 2021-10-03 ENCOUNTER — Other Ambulatory Visit: Payer: Self-pay

## 2021-10-03 ENCOUNTER — Ambulatory Visit (INDEPENDENT_AMBULATORY_CARE_PROVIDER_SITE_OTHER): Payer: Medicare PPO | Admitting: Internal Medicine

## 2021-10-03 DIAGNOSIS — E7800 Pure hypercholesterolemia, unspecified: Secondary | ICD-10-CM | POA: Diagnosis not present

## 2021-10-03 DIAGNOSIS — Z86010 Personal history of colonic polyps: Secondary | ICD-10-CM | POA: Diagnosis not present

## 2021-10-03 DIAGNOSIS — I8392 Asymptomatic varicose veins of left lower extremity: Secondary | ICD-10-CM

## 2021-10-03 DIAGNOSIS — D696 Thrombocytopenia, unspecified: Secondary | ICD-10-CM

## 2021-10-03 DIAGNOSIS — Z720 Tobacco use: Secondary | ICD-10-CM

## 2021-10-03 MED ORDER — CLOTRIMAZOLE-BETAMETHASONE 1-0.05 % EX CREA: 1.0000 "application " | TOPICAL_CREAM | Freq: Every day | CUTANEOUS | 0 refills | Status: AC
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
## 2021-10-04 ENCOUNTER — Encounter: Payer: Self-pay | Admitting: Internal Medicine

## 2021-10-04 NOTE — Assessment & Plan Note (Signed)
Follow CBC. Overdue.  Check with next labs.  ?

## 2021-10-04 NOTE — Assessment & Plan Note (Signed)
Have discussed the need to quit smoking.  Have discussed obtaining screening CT chest.  He has declined.  

## 2021-10-04 NOTE — Assessment & Plan Note (Signed)
Colonoscopy 06/2020 - recommended f/u colonoscopy in 5 years.  

## 2021-10-04 NOTE — Assessment & Plan Note (Signed)
Compression hose.  Leg elevation.  Has seen vascular surgery.   

## 2021-10-04 NOTE — Assessment & Plan Note (Signed)
Continue Crestor.  Low-cholesterol diet and exercise.  Follow lipid panel liver function tests. ?Lab Results  ?Component Value Date  ? CHOL 160 08/26/2020  ? HDL 32.40 (L) 08/26/2020  ? LDLCALC 102 (H) 08/26/2020  ? LDLDIRECT 89.0 02/24/2020  ? TRIG 128.0 08/26/2020  ? CHOLHDL 5 08/26/2020  ? ?

## 2021-10-19 ENCOUNTER — Other Ambulatory Visit (INDEPENDENT_AMBULATORY_CARE_PROVIDER_SITE_OTHER): Payer: Medicare PPO

## 2021-10-19 DIAGNOSIS — D696 Thrombocytopenia, unspecified: Secondary | ICD-10-CM

## 2021-10-19 DIAGNOSIS — Z1159 Encounter for screening for other viral diseases: Secondary | ICD-10-CM | POA: Diagnosis not present

## 2021-10-19 DIAGNOSIS — E7800 Pure hypercholesterolemia, unspecified: Secondary | ICD-10-CM

## 2021-10-19 DIAGNOSIS — Z125 Encounter for screening for malignant neoplasm of prostate: Secondary | ICD-10-CM | POA: Diagnosis not present

## 2021-10-19 LAB — BASIC METABOLIC PANEL
BUN: 9 mg/dL (ref 6–23)
CO2: 29 mEq/L (ref 19–32)
Calcium: 9.5 mg/dL (ref 8.4–10.5)
Chloride: 105 mEq/L (ref 96–112)
Creatinine, Ser: 1.26 mg/dL (ref 0.40–1.50)
GFR: 54.1 mL/min — ABNORMAL LOW (ref >60.00)
Glucose, Bld: 100 mg/dL — ABNORMAL HIGH (ref 70–99)
Potassium: 4.2 mEq/L (ref 3.5–5.1)
Sodium: 140 mEq/L (ref 135–145)

## 2021-10-19 LAB — LIPID PANEL
Cholesterol: 143 mg/dL (ref 0–200)
HDL: 31.8 mg/dL — ABNORMAL LOW (ref >39.00)
LDL Cholesterol: 86 mg/dL (ref 0–99)
NonHDL: 110.89
Total CHOL/HDL Ratio: 4
Triglycerides: 126 mg/dL (ref 0.0–149.0)
VLDL: 25.2 mg/dL (ref 0.0–40.0)

## 2021-10-19 LAB — CBC WITH DIFFERENTIAL/PLATELET
Basophils Absolute: 0.1 10*3/uL (ref 0.0–0.1)
Basophils Relative: 1.5 % (ref 0.0–3.0)
Eosinophils Absolute: 0.2 10*3/uL (ref 0.0–0.7)
Eosinophils Relative: 5.4 % — ABNORMAL HIGH (ref 0.0–5.0)
HCT: 44.4 % (ref 39.0–52.0)
Hemoglobin: 15 g/dL (ref 13.0–17.0)
Lymphocytes Relative: 39 % (ref 12.0–46.0)
Lymphs Abs: 1.5 10*3/uL (ref 0.7–4.0)
MCHC: 33.7 g/dL (ref 30.0–36.0)
MCV: 95.9 fl (ref 78.0–100.0)
Monocytes Absolute: 0.3 10*3/uL (ref 0.1–1.0)
Monocytes Relative: 7.2 % (ref 3.0–12.0)
Neutro Abs: 1.7 10*3/uL (ref 1.4–7.7)
Neutrophils Relative %: 46.9 % (ref 43.0–77.0)
Platelets: 150 10*3/uL (ref 150.0–400.0)
RBC: 4.64 Mil/uL (ref 4.22–5.81)
RDW: 14.3 % (ref 11.5–15.5)
WBC: 3.7 10*3/uL — ABNORMAL LOW (ref 4.0–10.5)

## 2021-10-19 LAB — HEPATIC FUNCTION PANEL
ALT: 8 U/L (ref 0–53)
AST: 14 U/L (ref 0–37)
Albumin: 4.3 g/dL (ref 3.5–5.2)
Alkaline Phosphatase: 45 U/L (ref 39–117)
Bilirubin, Direct: 0.2 mg/dL (ref 0.0–0.3)
Total Bilirubin: 0.8 mg/dL (ref 0.2–1.2)
Total Protein: 6.4 g/dL (ref 6.0–8.3)

## 2021-10-19 LAB — TSH: TSH: 1.93 u[IU]/mL (ref 0.35–5.50)

## 2021-10-19 LAB — PSA, MEDICARE: PSA: 1.1 ng/ml (ref 0.10–4.00)

## 2021-10-20 LAB — HEPATITIS C ANTIBODY
Hepatitis C Ab: NONREACTIVE
SIGNAL TO CUT-OFF: 0.1 (ref <1.00)

## 2021-10-24 ENCOUNTER — Other Ambulatory Visit: Payer: Self-pay

## 2021-10-24 DIAGNOSIS — D72819 Decreased white blood cell count, unspecified: Secondary | ICD-10-CM

## 2021-12-06 ENCOUNTER — Other Ambulatory Visit (INDEPENDENT_AMBULATORY_CARE_PROVIDER_SITE_OTHER): Payer: Medicare PPO

## 2021-12-06 DIAGNOSIS — D72819 Decreased white blood cell count, unspecified: Secondary | ICD-10-CM

## 2021-12-06 LAB — CBC WITH DIFFERENTIAL/PLATELET
Basophils Absolute: 0 10*3/uL (ref 0.0–0.1)
Basophils Relative: 0.8 % (ref 0.0–3.0)
Eosinophils Absolute: 0.1 10*3/uL (ref 0.0–0.7)
Eosinophils Relative: 3.2 % (ref 0.0–5.0)
HCT: 41.5 % (ref 39.0–52.0)
Hemoglobin: 13.9 g/dL (ref 13.0–17.0)
Lymphocytes Relative: 43.9 % (ref 12.0–46.0)
Lymphs Abs: 1.7 10*3/uL (ref 0.7–4.0)
MCHC: 33.6 g/dL (ref 30.0–36.0)
MCV: 96.5 fl (ref 78.0–100.0)
Monocytes Absolute: 0.3 10*3/uL (ref 0.1–1.0)
Monocytes Relative: 8.1 % (ref 3.0–12.0)
Neutro Abs: 1.7 10*3/uL (ref 1.4–7.7)
Neutrophils Relative %: 44 % (ref 43.0–77.0)
Platelets: 145 10*3/uL — ABNORMAL LOW (ref 150.0–400.0)
RBC: 4.3 Mil/uL (ref 4.22–5.81)
RDW: 13.7 % (ref 11.5–15.5)
WBC: 3.8 10*3/uL — ABNORMAL LOW (ref 4.0–10.5)

## 2021-12-07 ENCOUNTER — Other Ambulatory Visit: Payer: Self-pay

## 2021-12-07 DIAGNOSIS — D696 Thrombocytopenia, unspecified: Secondary | ICD-10-CM

## 2022-02-09 ENCOUNTER — Other Ambulatory Visit (INDEPENDENT_AMBULATORY_CARE_PROVIDER_SITE_OTHER): Payer: Medicare PPO

## 2022-02-09 ENCOUNTER — Other Ambulatory Visit: Payer: Self-pay | Admitting: Internal Medicine

## 2022-02-09 DIAGNOSIS — E7800 Pure hypercholesterolemia, unspecified: Secondary | ICD-10-CM

## 2022-02-09 DIAGNOSIS — D696 Thrombocytopenia, unspecified: Secondary | ICD-10-CM

## 2022-02-09 DIAGNOSIS — N1831 Chronic kidney disease, stage 3a: Secondary | ICD-10-CM

## 2022-02-09 LAB — CBC WITH DIFFERENTIAL/PLATELET
Basophils Absolute: 0.1 10*3/uL (ref 0.0–0.1)
Basophils Relative: 1.5 % (ref 0.0–3.0)
Eosinophils Absolute: 0.2 10*3/uL (ref 0.0–0.7)
Eosinophils Relative: 5 % (ref 0.0–5.0)
HCT: 42.7 % (ref 39.0–52.0)
Hemoglobin: 14.5 g/dL (ref 13.0–17.0)
Lymphocytes Relative: 39.5 % (ref 12.0–46.0)
Lymphs Abs: 1.5 10*3/uL (ref 0.7–4.0)
MCHC: 34 g/dL (ref 30.0–36.0)
MCV: 96.4 fl (ref 78.0–100.0)
Monocytes Absolute: 0.3 10*3/uL (ref 0.1–1.0)
Monocytes Relative: 7.8 % (ref 3.0–12.0)
Neutro Abs: 1.7 10*3/uL (ref 1.4–7.7)
Neutrophils Relative %: 46.2 % (ref 43.0–77.0)
Platelets: 136 10*3/uL — ABNORMAL LOW (ref 150.0–400.0)
RBC: 4.43 Mil/uL (ref 4.22–5.81)
RDW: 14.2 % (ref 11.5–15.5)
WBC: 3.7 10*3/uL — ABNORMAL LOW (ref 4.0–10.5)

## 2022-02-09 NOTE — Progress Notes (Signed)
Orders placed for fasting labs.

## 2022-02-10 ENCOUNTER — Telehealth: Payer: Self-pay

## 2022-02-10 NOTE — Telephone Encounter (Signed)
LMTCb for lab results.

## 2022-03-29 ENCOUNTER — Other Ambulatory Visit: Payer: Medicare PPO

## 2022-03-30 ENCOUNTER — Other Ambulatory Visit (INDEPENDENT_AMBULATORY_CARE_PROVIDER_SITE_OTHER): Payer: Medicare PPO

## 2022-03-30 DIAGNOSIS — E7800 Pure hypercholesterolemia, unspecified: Secondary | ICD-10-CM | POA: Diagnosis not present

## 2022-03-30 DIAGNOSIS — D696 Thrombocytopenia, unspecified: Secondary | ICD-10-CM

## 2022-03-30 DIAGNOSIS — N1831 Chronic kidney disease, stage 3a: Secondary | ICD-10-CM | POA: Diagnosis not present

## 2022-03-30 LAB — HEPATIC FUNCTION PANEL
ALT: 7 U/L (ref 0–53)
AST: 15 U/L (ref 0–37)
Albumin: 4.1 g/dL (ref 3.5–5.2)
Alkaline Phosphatase: 47 U/L (ref 39–117)
Bilirubin, Direct: 0.1 mg/dL (ref 0.0–0.3)
Total Bilirubin: 0.7 mg/dL (ref 0.2–1.2)
Total Protein: 6.8 g/dL (ref 6.0–8.3)

## 2022-03-30 LAB — CBC WITH DIFFERENTIAL/PLATELET
Basophils Absolute: 0.1 10*3/uL (ref 0.0–0.1)
Basophils Relative: 1.9 % (ref 0.0–3.0)
Eosinophils Absolute: 0.2 10*3/uL (ref 0.0–0.7)
Eosinophils Relative: 5 % (ref 0.0–5.0)
HCT: 43.1 % (ref 39.0–52.0)
Hemoglobin: 14.5 g/dL (ref 13.0–17.0)
Lymphocytes Relative: 41.2 % (ref 12.0–46.0)
Lymphs Abs: 1.4 10*3/uL (ref 0.7–4.0)
MCHC: 33.5 g/dL (ref 30.0–36.0)
MCV: 96.6 fl (ref 78.0–100.0)
Monocytes Absolute: 0.3 10*3/uL (ref 0.1–1.0)
Monocytes Relative: 7.6 % (ref 3.0–12.0)
Neutro Abs: 1.5 10*3/uL (ref 1.4–7.7)
Neutrophils Relative %: 44.3 % (ref 43.0–77.0)
Platelets: 127 10*3/uL — ABNORMAL LOW (ref 150.0–400.0)
RBC: 4.46 Mil/uL (ref 4.22–5.81)
RDW: 14.1 % (ref 11.5–15.5)
WBC: 3.4 10*3/uL — ABNORMAL LOW (ref 4.0–10.5)

## 2022-03-30 LAB — LIPID PANEL
Cholesterol: 147 mg/dL (ref 0–200)
HDL: 34.3 mg/dL — ABNORMAL LOW (ref >39.00)
LDL Cholesterol: 99 mg/dL (ref 0–99)
NonHDL: 112.94
Total CHOL/HDL Ratio: 4
Triglycerides: 68 mg/dL (ref 0.0–149.0)
VLDL: 13.6 mg/dL (ref 0.0–40.0)

## 2022-03-30 LAB — BASIC METABOLIC PANEL
BUN: 12 mg/dL (ref 6–23)
CO2: 27 mEq/L (ref 19–32)
Calcium: 9.5 mg/dL (ref 8.4–10.5)
Chloride: 106 mEq/L (ref 96–112)
Creatinine, Ser: 1.44 mg/dL (ref 0.40–1.50)
GFR: 45.94 mL/min — ABNORMAL LOW (ref >60.00)
Glucose, Bld: 93 mg/dL (ref 70–99)
Potassium: 4.3 mEq/L (ref 3.5–5.1)
Sodium: 140 mEq/L (ref 135–145)

## 2022-04-05 ENCOUNTER — Encounter: Payer: Medicare PPO | Admitting: Internal Medicine

## 2022-04-11 ENCOUNTER — Encounter: Payer: Self-pay | Admitting: Internal Medicine

## 2022-04-11 ENCOUNTER — Ambulatory Visit (INDEPENDENT_AMBULATORY_CARE_PROVIDER_SITE_OTHER): Payer: Medicare PPO | Admitting: Internal Medicine

## 2022-04-11 VITALS — BP 120/70 | HR 50 | Temp 97.7°F | Ht 73.0 in | Wt 176.2 lb

## 2022-04-11 DIAGNOSIS — Z23 Encounter for immunization: Secondary | ICD-10-CM | POA: Diagnosis not present

## 2022-04-11 DIAGNOSIS — E7800 Pure hypercholesterolemia, unspecified: Secondary | ICD-10-CM

## 2022-04-11 DIAGNOSIS — Z720 Tobacco use: Secondary | ICD-10-CM | POA: Diagnosis not present

## 2022-04-11 DIAGNOSIS — N1831 Chronic kidney disease, stage 3a: Secondary | ICD-10-CM | POA: Diagnosis not present

## 2022-04-11 DIAGNOSIS — Z860100 Personal history of colon polyps, unspecified: Secondary | ICD-10-CM

## 2022-04-11 DIAGNOSIS — Z0000 Encounter for general adult medical examination without abnormal findings: Secondary | ICD-10-CM

## 2022-04-11 DIAGNOSIS — I8392 Asymptomatic varicose veins of left lower extremity: Secondary | ICD-10-CM | POA: Diagnosis not present

## 2022-04-11 DIAGNOSIS — D696 Thrombocytopenia, unspecified: Secondary | ICD-10-CM

## 2022-04-11 MED ORDER — ROSUVASTATIN CALCIUM 10 MG PO TABS: 10.0000 mg | ORAL_TABLET | Freq: Every day | ORAL | 1 refills | Status: DC

## 2022-04-11 NOTE — Assessment & Plan Note (Addendum)
Physical today 04/11/22.  Colonoscopy 06/2020 - non bleeding internal hemorrhoids, two 3-39m polyps in the ascending colon and in the cecum (tubular adenoma), diverticulosis.  Repeat colonoscopy in 5 years.  PSA 10/19/21 - 1.10.
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
## 2022-04-16 ENCOUNTER — Encounter: Payer: Self-pay | Admitting: Internal Medicine

## 2022-04-16 NOTE — Assessment & Plan Note (Signed)
Colonoscopy 06/2020 - recommended f/u colonoscopy in 5 years.

## 2022-04-16 NOTE — Assessment & Plan Note (Signed)
Have discussed the need to quit smoking.  Have discussed obtaining screening CT chest.  He has declined.

## 2022-04-16 NOTE — Assessment & Plan Note (Signed)
Compression hose.  Leg elevation.  Has seen vascular surgery.

## 2022-04-16 NOTE — Assessment & Plan Note (Signed)
Chronic and stable Recheck metabolic panel Avoid nephrotoxic meds  

## 2022-04-16 NOTE — Assessment & Plan Note (Signed)
Discussed recent labs.  Platelet count 127 - relatively stable.  Follow.

## 2022-04-16 NOTE — Assessment & Plan Note (Signed)
Continue Crestor.  Low-cholesterol diet and exercise.  Follow lipid panel liver function tests.

## 2022-05-23 ENCOUNTER — Other Ambulatory Visit (INDEPENDENT_AMBULATORY_CARE_PROVIDER_SITE_OTHER): Payer: Medicare PPO

## 2022-05-23 DIAGNOSIS — E7800 Pure hypercholesterolemia, unspecified: Secondary | ICD-10-CM

## 2022-05-23 DIAGNOSIS — D696 Thrombocytopenia, unspecified: Secondary | ICD-10-CM

## 2022-05-23 DIAGNOSIS — N1831 Chronic kidney disease, stage 3a: Secondary | ICD-10-CM

## 2022-05-23 LAB — HEPATIC FUNCTION PANEL
ALT: 8 U/L (ref 0–53)
AST: 13 U/L (ref 0–37)
Albumin: 4.4 g/dL (ref 3.5–5.2)
Alkaline Phosphatase: 46 U/L (ref 39–117)
Bilirubin, Direct: 0.2 mg/dL (ref 0.0–0.3)
Total Bilirubin: 0.7 mg/dL (ref 0.2–1.2)
Total Protein: 6.7 g/dL (ref 6.0–8.3)

## 2022-05-23 LAB — BASIC METABOLIC PANEL
BUN: 12 mg/dL (ref 6–23)
CO2: 30 mEq/L (ref 19–32)
Calcium: 9.6 mg/dL (ref 8.4–10.5)
Chloride: 105 mEq/L (ref 96–112)
Creatinine, Ser: 1.31 mg/dL (ref 0.40–1.50)
GFR: 51.41 mL/min — ABNORMAL LOW (ref >60.00)
Glucose, Bld: 83 mg/dL (ref 70–99)
Potassium: 4.6 mEq/L (ref 3.5–5.1)
Sodium: 141 mEq/L (ref 135–145)

## 2022-05-23 LAB — CBC WITH DIFFERENTIAL/PLATELET
Basophils Absolute: 0.1 10*3/uL (ref 0.0–0.1)
Basophils Relative: 2.1 % (ref 0.0–3.0)
Eosinophils Absolute: 0.2 10*3/uL (ref 0.0–0.7)
Eosinophils Relative: 4.6 % (ref 0.0–5.0)
HCT: 45.5 % (ref 39.0–52.0)
Hemoglobin: 14.8 g/dL (ref 13.0–17.0)
Lymphocytes Relative: 41.7 % (ref 12.0–46.0)
Lymphs Abs: 1.7 10*3/uL (ref 0.7–4.0)
MCHC: 32.6 g/dL (ref 30.0–36.0)
MCV: 98.3 fl (ref 78.0–100.0)
Monocytes Absolute: 0.3 10*3/uL (ref 0.1–1.0)
Monocytes Relative: 8 % (ref 3.0–12.0)
Neutro Abs: 1.7 10*3/uL (ref 1.4–7.7)
Neutrophils Relative %: 43.6 % (ref 43.0–77.0)
Platelets: 139 10*3/uL — ABNORMAL LOW (ref 150.0–400.0)
RBC: 4.62 Mil/uL (ref 4.22–5.81)
RDW: 13.9 % (ref 11.5–15.5)
WBC: 4 10*3/uL (ref 4.0–10.5)

## 2022-05-24 ENCOUNTER — Telehealth: Payer: Self-pay

## 2022-05-24 NOTE — Telephone Encounter (Signed)
LM FOR PT to cb RE:   Notify kidney function has improved from last check.  Will continue to follow. Continue to avoid antiinflammatory medication.  White blood cell count is wnl.  Platelet count stable.  Still slightly decreased, but stable.  Will follow.   Hgb and liver function tests wnl.

## 2022-08-25 ENCOUNTER — Ambulatory Visit (INDEPENDENT_AMBULATORY_CARE_PROVIDER_SITE_OTHER): Payer: Medicare Other

## 2022-08-25 VITALS — Ht 73.0 in | Wt 176.0 lb

## 2022-08-25 DIAGNOSIS — Z0000 Encounter for general adult medical examination without abnormal findings: Secondary | ICD-10-CM
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
## 2022-08-25 NOTE — Patient Instructions (Addendum)
Michael Wiley , Thank you for taking time to come for your Medicare Wellness Visit. I appreciate your ongoing commitment to your health goals. Please review the following plan we discussed and let me know if I can assist you in the future.   These are the goals we discussed:  Goals       Patient Stated     Maintain Healthy Lifestyle (pt-stated)      Stay active Healthy diet Smoke less         This is a list of the screening recommended for you and due dates:  Health Maintenance  Topic Date Due   COVID-19 Vaccine (4 - 2023-24 season) 09/10/2022*   Zoster (Shingles) Vaccine (1 of 2) 11/23/2022*   Medicare Annual Wellness Visit  08/26/2023   DTaP/Tdap/Td vaccine (2 - Td or Tdap) 12/16/2027   Pneumonia Vaccine  Completed   Flu Shot  Completed   HPV Vaccine  Aged Out  *Topic was postponed. The date shown is not the original due date.   Conditions/risks identified: none new  Next appointment: Follow up in one year for your annual wellness visit.   Preventive Care 25 Years and Older, Male  Preventive care refers to lifestyle choices and visits with your health care provider that can promote health and wellness. What does preventive care include? A yearly physical exam. This is also called an annual well check. Dental exams once or twice a year. Routine eye exams. Ask your health care provider how often you should have your eyes checked. Personal lifestyle choices, including: Daily care of your teeth and gums. Regular physical activity. Eating a healthy diet. Avoiding tobacco and drug use. Limiting alcohol use. Practicing safe sex. Taking low doses of aspirin every day. Taking vitamin and mineral supplements as recommended by your health care provider. What happens during an annual well check? The services and screenings done by your health care provider during your annual well check will depend on your age, overall health, lifestyle risk factors, and family history of  disease. Counseling  Your health care provider may ask you questions about your: Alcohol use. Tobacco use. Drug use. Emotional well-being. Home and relationship well-being. Sexual activity. Eating habits. History of falls. Memory and ability to understand (cognition). Work and work Statistician. Screening  You may have the following tests or measurements: Height, weight, and BMI. Blood pressure. Lipid and cholesterol levels. These may be checked every 5 years, or more frequently if you are over 53 years old. Skin check. Lung cancer screening. You may have this screening every year starting at age 98 if you have a 30-pack-year history of smoking and currently smoke or have quit within the past 15 years. Fecal occult blood test (FOBT) of the stool. You may have this test every year starting at age 52. Flexible sigmoidoscopy or colonoscopy. You may have a sigmoidoscopy every 5 years or a colonoscopy every 10 years starting at age 90. Prostate cancer screening. Recommendations will vary depending on your family history and other risks. Hepatitis C blood test. Hepatitis B blood test. Sexually transmitted disease (STD) testing. Diabetes screening. This is done by checking your blood sugar (glucose) after you have not eaten for a while (fasting). You may have this done every 1-3 years. Abdominal aortic aneurysm (AAA) screening. You may need this if you are a current or former smoker. Osteoporosis. You may be screened starting at age 69 if you are at high risk. Talk with your health care provider about your test  results, treatment options, and if necessary, the need for more tests. Vaccines  Your health care provider may recommend certain vaccines, such as: Influenza vaccine. This is recommended every year. Tetanus, diphtheria, and acellular pertussis (Tdap, Td) vaccine. You may need a Td booster every 10 years. Zoster vaccine. You may need this after age 27. Pneumococcal 13-valent  conjugate (PCV13) vaccine. One dose is recommended after age 53. Pneumococcal polysaccharide (PPSV23) vaccine. One dose is recommended after age 40. Talk to your health care provider about which screenings and vaccines you need and how often you need them. This information is not intended to replace advice given to you by your health care provider. Make sure you discuss any questions you have with your health care provider. Document Released: 07/30/2015 Document Revised: 03/22/2016 Document Reviewed: 05/04/2015 Elsevier Interactive Patient Education  2017 [place] Prevention in the Home Falls can cause injuries. They can happen to people of all ages. There are many things you can do to make your home safe and to help prevent falls. What can I do on the outside of my home? Regularly fix the edges of walkways and driveways and fix any cracks. Remove anything that might make you trip as you walk through a door, such as a raised step or threshold. Trim any bushes or trees on the path to your home. Use bright outdoor lighting. Clear any walking paths of anything that might make someone trip, such as rocks or tools. Regularly check to see if handrails are loose or broken. Make sure that both sides of any steps have handrails. Any raised decks and porches should have guardrails on the edges. Have any leaves, snow, or ice cleared regularly. Use sand or salt on walking paths during winter. Clean up any spills in your garage right away. This includes oil or grease spills. What can I do in the bathroom? Use night lights. Install grab bars by the toilet and in the tub and shower. Do not use towel bars as grab bars. Use non-skid mats or decals in the tub or shower. If you need to sit down in the shower, use a plastic, non-slip stool. Keep the floor dry. Clean up any water that spills on the floor as soon as it happens. Remove soap buildup in the tub or shower regularly. Attach bath mats  securely with double-sided non-slip rug tape. Do not have throw rugs and other things on the floor that can make you trip. What can I do in the bedroom? Use night lights. Make sure that you have a light by your bed that is easy to reach. Do not use any sheets or blankets that are too big for your bed. They should not hang down onto the floor. Have a firm chair that has side arms. You can use this for support while you get dressed. Do not have throw rugs and other things on the floor that can make you trip. What can I do in the kitchen? Clean up any spills right away. Avoid walking on wet floors. Keep items that you use a lot in easy-to-reach places. If you need to reach something above you, use a strong step stool that has a grab bar. Keep electrical cords out of the way. Do not use floor polish or wax that makes floors slippery. If you must use wax, use non-skid floor wax. Do not have throw rugs and other things on the floor that can make you trip. What can I do with my stairs?  Do not leave any items on the stairs. Make sure that there are handrails on both sides of the stairs and use them. Fix handrails that are broken or loose. Make sure that handrails are as long as the stairways. Check any carpeting to make sure that it is firmly attached to the stairs. Fix any carpet that is loose or worn. Avoid having throw rugs at the top or bottom of the stairs. If you do have throw rugs, attach them to the floor with carpet tape. Make sure that you have a light switch at the top of the stairs and the bottom of the stairs. If you do not have them, ask someone to add them for you. What else can I do to help prevent falls? Wear shoes that: Do not have high heels. Have rubber bottoms. Are comfortable and fit you well. Are closed at the toe. Do not wear sandals. If you use a stepladder: Make sure that it is fully opened. Do not climb a closed stepladder. Make sure that both sides of the stepladder  are locked into place. Ask someone to hold it for you, if possible. Clearly mark and make sure that you can see: Any grab bars or handrails. First and last steps. Where the edge of each step is. Use tools that help you move around (mobility aids) if they are needed. These include: Canes. Walkers. Scooters. Crutches. Turn on the lights when you go into a dark area. Replace any light bulbs as soon as they burn out. Set up your furniture so you have a clear path. Avoid moving your furniture around. If any of your floors are uneven, fix them. If there are any pets around you, be aware of where they are. Review your medicines with your doctor. Some medicines can make you feel dizzy. This can increase your chance of falling. Ask your doctor what other things that you can do to help prevent falls. This information is not intended to replace advice given to you by your health care provider. Make sure you discuss any questions you have with your health care provider. Document Released: 04/29/2009 Document Revised: 12/09/2015 Document Reviewed: 08/07/2014 Elsevier Interactive Patient Education  2017 [employer].

## 2022-09-19 DIAGNOSIS — D23112 Other benign neoplasm of skin of right lower eyelid, including canthus: Secondary | ICD-10-CM | POA: Diagnosis not present

## 2022-10-03 ENCOUNTER — Encounter: Payer: Self-pay | Admitting: Internal Medicine

## 2022-10-07 ENCOUNTER — Other Ambulatory Visit: Payer: Self-pay | Admitting: Internal Medicine

## 2022-10-10 ENCOUNTER — Ambulatory Visit: Payer: Medicare PPO | Admitting: Internal Medicine

## 2022-10-12 DIAGNOSIS — D485 Neoplasm of uncertain behavior of skin: Secondary | ICD-10-CM | POA: Diagnosis not present

## 2022-10-12 DIAGNOSIS — D23112 Other benign neoplasm of skin of right lower eyelid, including canthus: Secondary | ICD-10-CM | POA: Diagnosis not present

## 2022-10-17 DIAGNOSIS — H40003 Preglaucoma, unspecified, bilateral: Secondary | ICD-10-CM | POA: Diagnosis not present

## 2022-10-17 DIAGNOSIS — H538 Other visual disturbances: Secondary | ICD-10-CM | POA: Diagnosis not present

## 2022-10-17 DIAGNOSIS — Z961 Presence of intraocular lens: Secondary | ICD-10-CM | POA: Diagnosis not present

## 2022-10-20 ENCOUNTER — Ambulatory Visit (INDEPENDENT_AMBULATORY_CARE_PROVIDER_SITE_OTHER): Payer: Medicare Other | Admitting: Internal Medicine

## 2022-10-20 ENCOUNTER — Encounter: Payer: Self-pay | Admitting: Internal Medicine

## 2022-10-20 VITALS — BP 128/78 | HR 56 | Temp 97.9°F | Ht 75.0 in | Wt 181.0 lb

## 2022-10-20 DIAGNOSIS — E7800 Pure hypercholesterolemia, unspecified: Secondary | ICD-10-CM

## 2022-10-20 DIAGNOSIS — D696 Thrombocytopenia, unspecified: Secondary | ICD-10-CM | POA: Diagnosis not present

## 2022-10-20 DIAGNOSIS — Z720 Tobacco use: Secondary | ICD-10-CM | POA: Diagnosis not present

## 2022-10-20 DIAGNOSIS — Z86010 Personal history of colonic polyps: Secondary | ICD-10-CM | POA: Diagnosis not present

## 2022-10-20 DIAGNOSIS — E559 Vitamin D deficiency, unspecified: Secondary | ICD-10-CM | POA: Diagnosis not present

## 2022-10-20 DIAGNOSIS — N1831 Chronic kidney disease, stage 3a: Secondary | ICD-10-CM

## 2022-10-20 DIAGNOSIS — Z125 Encounter for screening for malignant neoplasm of prostate: Secondary | ICD-10-CM | POA: Diagnosis not present

## 2022-10-20 DIAGNOSIS — R030 Elevated blood-pressure reading, without diagnosis of hypertension: Secondary | ICD-10-CM | POA: Diagnosis not present
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
## 2022-10-22 ENCOUNTER — Encounter: Payer: Self-pay | Admitting: Internal Medicine

## 2022-10-22 NOTE — Assessment & Plan Note (Signed)
Initially elevated.  Recheck 128/78.  Follow.

## 2022-10-22 NOTE — Assessment & Plan Note (Signed)
Follow vitamin D level.  

## 2022-10-22 NOTE — Assessment & Plan Note (Signed)
Last platelet count 139 - relatively stable.  Follow.  Recheck cbc with next labs.

## 2022-10-22 NOTE — Assessment & Plan Note (Signed)
Continue Crestor.  Low-cholesterol diet and exercise.  Follow lipid panel liver function tests. 

## 2022-10-22 NOTE — Assessment & Plan Note (Signed)
Chronic and stable Recheck metabolic panel Avoid nephrotoxic meds 

## 2022-10-22 NOTE — Assessment & Plan Note (Signed)
Colonoscopy 06/2020 - recommended f/u colonoscopy in 5 years.  ?

## 2022-10-22 NOTE — Assessment & Plan Note (Addendum)
Discussed the need to quit smoking.  Have discussed obtaining screening CT chest.  He has declined. Continues to decline.  Discussed today.  Has decreased amount smoking per day.

## 2022-11-16 ENCOUNTER — Other Ambulatory Visit (INDEPENDENT_AMBULATORY_CARE_PROVIDER_SITE_OTHER): Payer: Medicare Other

## 2022-11-16 DIAGNOSIS — E7800 Pure hypercholesterolemia, unspecified: Secondary | ICD-10-CM

## 2022-11-16 DIAGNOSIS — E559 Vitamin D deficiency, unspecified: Secondary | ICD-10-CM

## 2022-11-16 DIAGNOSIS — Z125 Encounter for screening for malignant neoplasm of prostate: Secondary | ICD-10-CM

## 2022-11-16 DIAGNOSIS — D696 Thrombocytopenia, unspecified: Secondary | ICD-10-CM | POA: Diagnosis not present

## 2022-11-16 LAB — CBC WITH DIFFERENTIAL/PLATELET
Basophils Absolute: 0 10*3/uL (ref 0.0–0.1)
Basophils Relative: 1.4 % (ref 0.0–3.0)
Eosinophils Absolute: 0.1 10*3/uL (ref 0.0–0.7)
Eosinophils Relative: 4 % (ref 0.0–5.0)
HCT: 44.6 % (ref 39.0–52.0)
Hemoglobin: 14.8 g/dL (ref 13.0–17.0)
Lymphocytes Relative: 35.9 % (ref 12.0–46.0)
Lymphs Abs: 1.1 10*3/uL (ref 0.7–4.0)
MCHC: 33.3 g/dL (ref 30.0–36.0)
MCV: 96.6 fl (ref 78.0–100.0)
Monocytes Absolute: 0.2 10*3/uL (ref 0.1–1.0)
Monocytes Relative: 7.5 % (ref 3.0–12.0)
Neutro Abs: 1.6 10*3/uL (ref 1.4–7.7)
Neutrophils Relative %: 51.2 % (ref 43.0–77.0)
Platelets: 146 10*3/uL — ABNORMAL LOW (ref 150.0–400.0)
RBC: 4.62 Mil/uL (ref 4.22–5.81)
RDW: 14.6 % (ref 11.5–15.5)
WBC: 3.2 10*3/uL — ABNORMAL LOW (ref 4.0–10.5)

## 2022-11-16 LAB — BASIC METABOLIC PANEL
BUN: 13 mg/dL (ref 6–23)
CO2: 28 mEq/L (ref 19–32)
Calcium: 9.3 mg/dL (ref 8.4–10.5)
Chloride: 105 mEq/L (ref 96–112)
Creatinine, Ser: 1.29 mg/dL (ref 0.40–1.50)
GFR: 52.19 mL/min — ABNORMAL LOW (ref >60.00)
Glucose, Bld: 91 mg/dL (ref 70–99)
Potassium: 4.4 mEq/L (ref 3.5–5.1)
Sodium: 141 mEq/L (ref 135–145)

## 2022-11-16 LAB — LIPID PANEL
Cholesterol: 138 mg/dL (ref 0–200)
HDL: 33.4 mg/dL — ABNORMAL LOW (ref >39.00)
LDL Cholesterol: 88 mg/dL (ref 0–99)
NonHDL: 105.02
Total CHOL/HDL Ratio: 4
Triglycerides: 87 mg/dL (ref 0.0–149.0)
VLDL: 17.4 mg/dL (ref 0.0–40.0)

## 2022-11-16 LAB — VITAMIN D 25 HYDROXY (VIT D DEFICIENCY, FRACTURES): VITD: 12.04 ng/mL — ABNORMAL LOW (ref 30.00–100.00)

## 2022-11-16 LAB — HEPATIC FUNCTION PANEL
ALT: 8 U/L (ref 0–53)
AST: 14 U/L (ref 0–37)
Albumin: 4.1 g/dL (ref 3.5–5.2)
Alkaline Phosphatase: 45 U/L (ref 39–117)
Bilirubin, Direct: 0.1 mg/dL (ref 0.0–0.3)
Total Bilirubin: 0.6 mg/dL (ref 0.2–1.2)
Total Protein: 6.5 g/dL (ref 6.0–8.3)

## 2022-11-16 LAB — PSA, MEDICARE: PSA: 0.84 ng/ml (ref 0.10–4.00)

## 2022-11-16 LAB — TSH: TSH: 1.89 u[IU]/mL (ref 0.35–5.50)

## 2022-11-17 ENCOUNTER — Other Ambulatory Visit: Payer: Self-pay

## 2022-11-17 DIAGNOSIS — E559 Vitamin D deficiency, unspecified: Secondary | ICD-10-CM

## 2022-11-17 DIAGNOSIS — H40003 Preglaucoma, unspecified, bilateral: Secondary | ICD-10-CM | POA: Diagnosis not present

## 2022-11-17 DIAGNOSIS — Z961 Presence of intraocular lens: Secondary | ICD-10-CM | POA: Diagnosis not present

## 2022-11-17 MED ORDER — ERGOCALCIFEROL 1.25 MG (50000 UT) PO CAPS: 50000.0000 [IU] | ORAL_CAPSULE | ORAL | 0 refills | Status: AC

## 2022-11-17 NOTE — Progress Notes (Unsigned)
Pt scheduled for 12 wk vit D lab to recheck level per Dr Lorin Picket. Lab orderd

## 2023-02-08 IMAGING — US US EXTREM LOW VENOUS*L*
1 series · 14 of 24 positions shown · non-contrast
Comparison: None.

CLINICAL DATA: Left lower extremity pain with redness.

EXAM:
LEFT LOWER EXTREMITY VENOUS DOPPLER ULTRASOUND
TECHNIQUE: Gray-scale sonography with compression, as well as color and duplex
ultrasound, were performed to evaluate the deep venous system(s)
from the level of the common femoral vein through the popliteal and
proximal calf veins.

[Series 1: us venous img lower uni left (dvt) · portal-venous · 36 acquisitions, 14 frames shown]
[im 1/36]
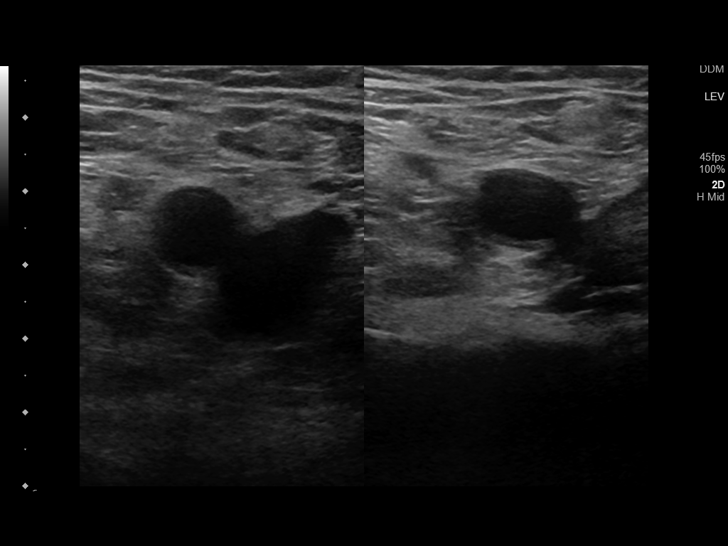
[im 4/36]
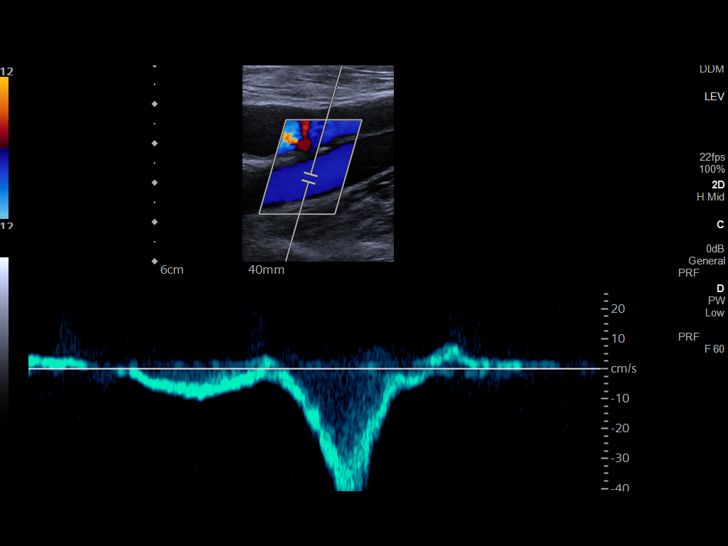
[im 7/36]
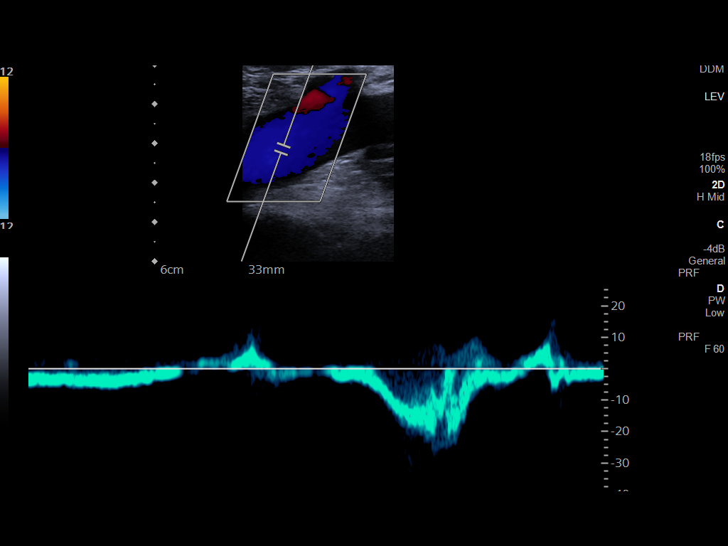
[im 10/36]
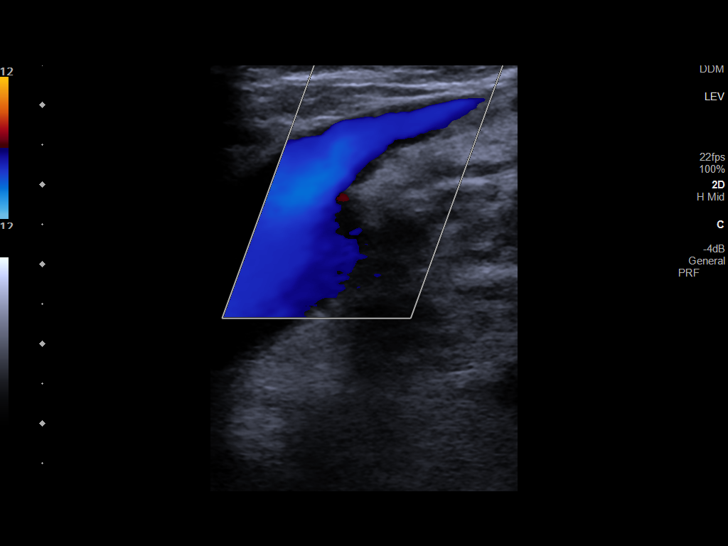
[im 11/36]
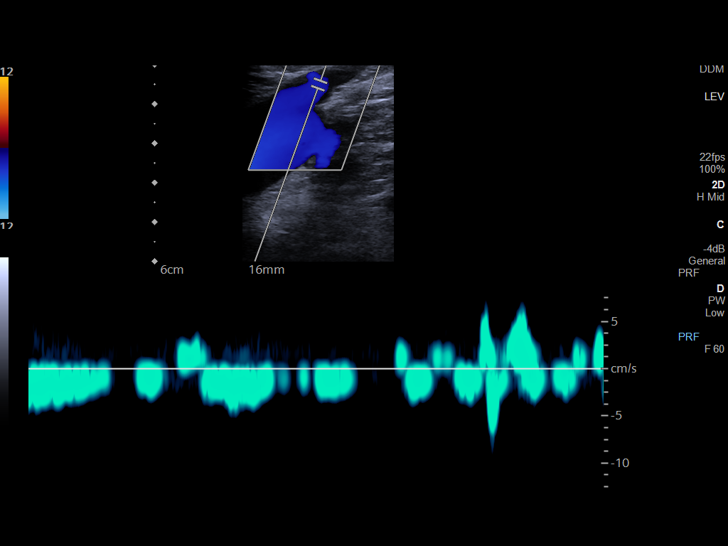
[im 14/36]
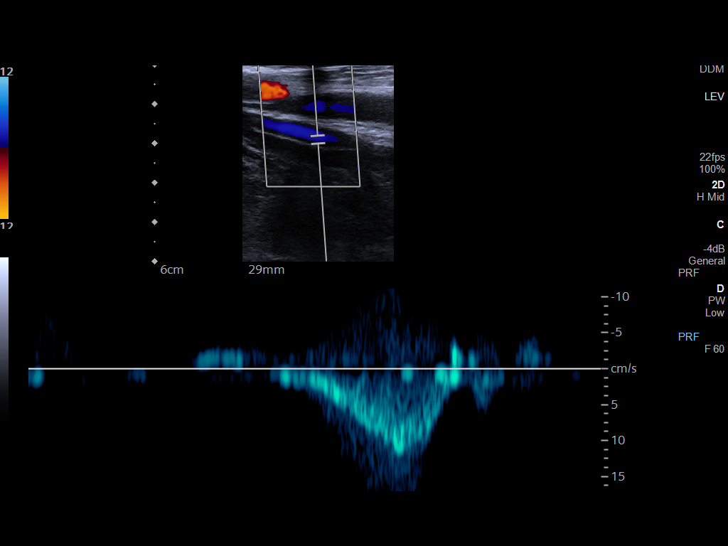
[im 17/36]
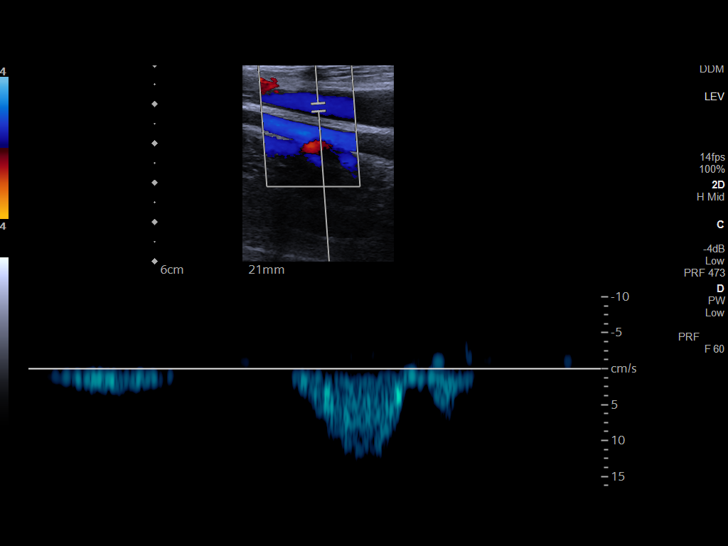
[im 20/36]
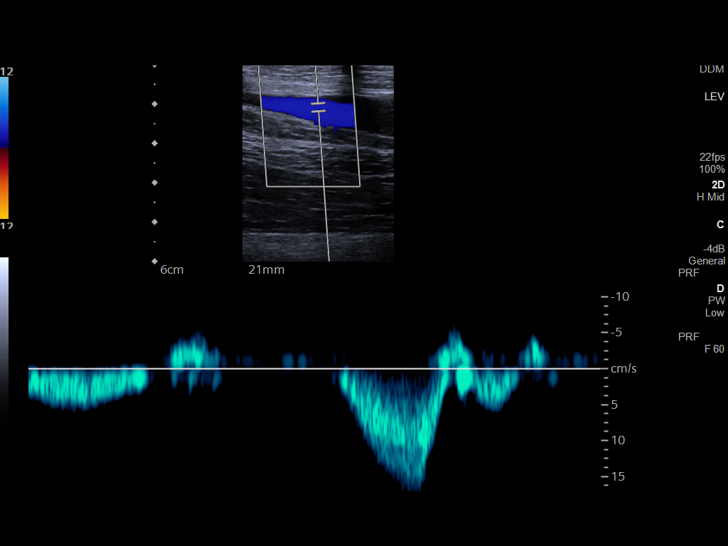
[im 23/36]
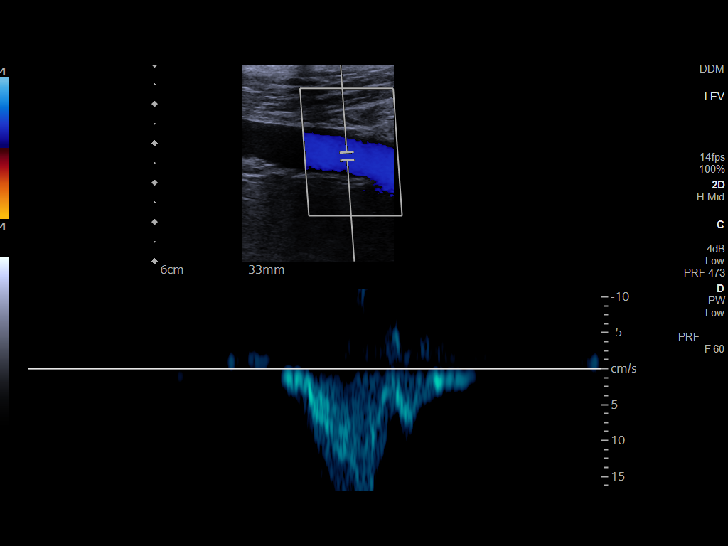
[im 26/36]
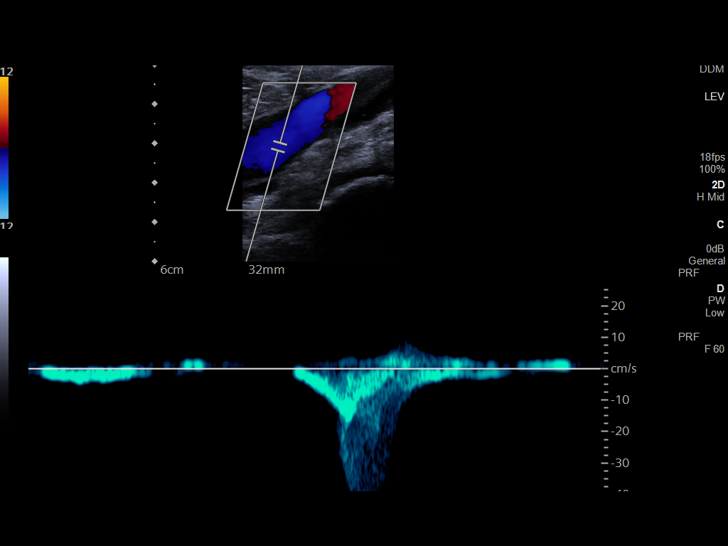
[im 29/36]
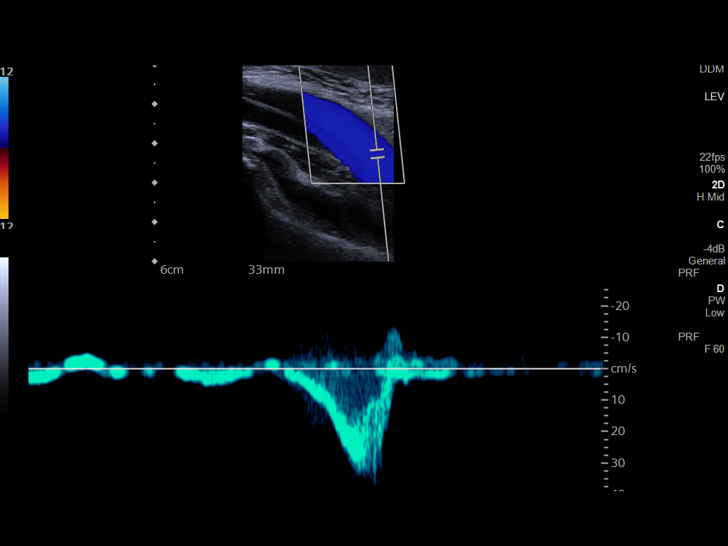
[im 31/36]
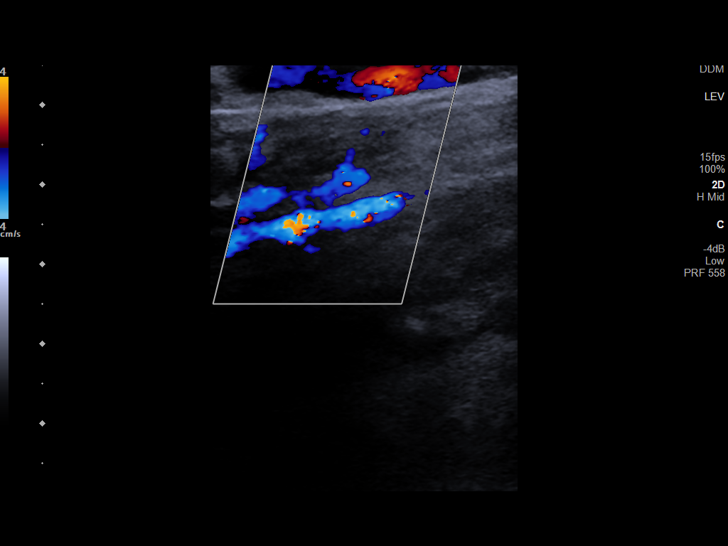
[im 34/36]
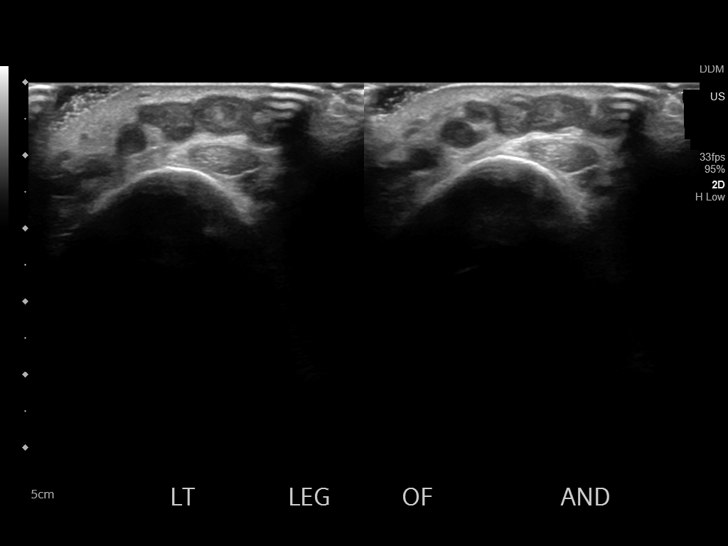
[im 36/36]
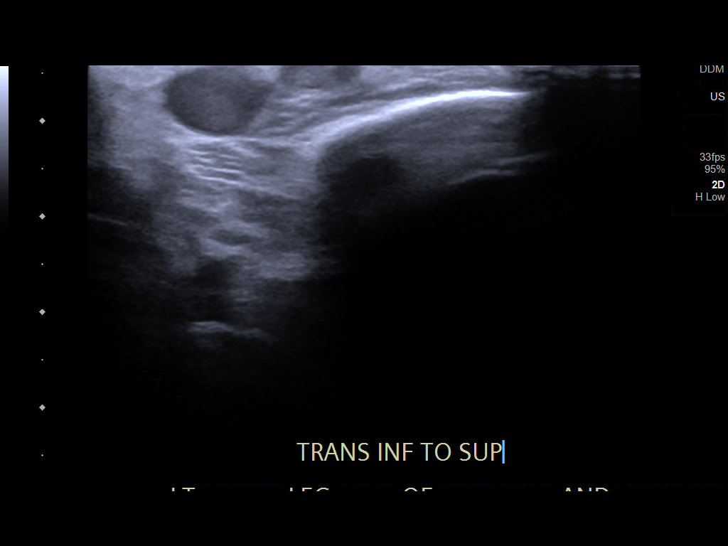

[14 of 24 positions shown; findings below may reference images not displayed]

FINDINGS: VENOUS

Normal compressibility of the common femoral, superficial femoral,
and popliteal veins, as well as the visualized calf veins.
Visualized portions of profunda femoral vein and great saphenous
vein unremarkable. No filling defects to suggest DVT on grayscale or
color Doppler imaging. Doppler waveforms show normal direction of
venous flow, normal respiratory plasticity and response to
augmentation.

Limited views of the contralateral common femoral vein are
unremarkable.

OTHER

Within the area of redness and swelling, there are thrombosed
varicosities.

Limitations: none
IMPRESSION: 1. No evidence of left lower extremity deep venous thrombosis.
2. Thrombosed varicosities within the left lower extremity, at the
site of pain and redness.

## 2023-02-13 ENCOUNTER — Other Ambulatory Visit (INDEPENDENT_AMBULATORY_CARE_PROVIDER_SITE_OTHER): Payer: Medicare Other

## 2023-02-13 ENCOUNTER — Telehealth: Payer: Self-pay | Admitting: Internal Medicine

## 2023-02-13 DIAGNOSIS — E559 Vitamin D deficiency, unspecified: Secondary | ICD-10-CM

## 2023-02-13 LAB — VITAMIN D 25 HYDROXY (VIT D DEFICIENCY, FRACTURES): VITD: 42.33 ng/mL (ref 30.00–100.00)

## 2023-02-13 NOTE — Telephone Encounter (Signed)
Form needed for wife.

## 2023-02-13 NOTE — Telephone Encounter (Signed)
Ok to fill out handicap form? Wasn't sure if both him and his wife need one?

## 2023-02-13 NOTE — Telephone Encounter (Signed)
Patient called and would like a handicap plaque filled out, never had one. Please call when ready.

## 2023-02-13 NOTE — Telephone Encounter (Signed)
Is this for his wife - if so, they can get two I believe with the form.  If for him, would need to clarify why he needs.  See message from wife.

## 2023-03-28 DIAGNOSIS — H5203 Hypermetropia, bilateral: Secondary | ICD-10-CM | POA: Diagnosis not present

## 2023-03-28 DIAGNOSIS — H40013 Open angle with borderline findings, low risk, bilateral: Secondary | ICD-10-CM | POA: Diagnosis not present

## 2023-03-28 DIAGNOSIS — Z961 Presence of intraocular lens: Secondary | ICD-10-CM | POA: Diagnosis not present

## 2023-03-28 DIAGNOSIS — H26493 Other secondary cataract, bilateral: Secondary | ICD-10-CM | POA: Diagnosis not present
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
## 2023-04-22 NOTE — Assessment & Plan Note (Signed)
Physical today 04/23/23.  Colonoscopy 06/2020 - non bleeding internal hemorrhoids, two 3-81mm polyps in the ascending colon and in the cecum (tubular adenoma), diverticulosis.  Repeat colonoscopy in 5 years.  PSA 11/16/22 - .84

## 2023-04-23 ENCOUNTER — Encounter: Payer: Self-pay | Admitting: Internal Medicine

## 2023-04-23 ENCOUNTER — Ambulatory Visit (INDEPENDENT_AMBULATORY_CARE_PROVIDER_SITE_OTHER): Payer: Medicare Other | Admitting: Internal Medicine

## 2023-04-23 VITALS — BP 120/84 | HR 45 | Temp 97.8°F | Resp 17 | Ht 71.75 in | Wt 178.4 lb

## 2023-04-23 DIAGNOSIS — N1831 Chronic kidney disease, stage 3a: Secondary | ICD-10-CM

## 2023-04-23 DIAGNOSIS — R001 Bradycardia, unspecified: Secondary | ICD-10-CM | POA: Diagnosis not present

## 2023-04-23 DIAGNOSIS — Z0000 Encounter for general adult medical examination without abnormal findings: Secondary | ICD-10-CM | POA: Diagnosis not present

## 2023-04-23 DIAGNOSIS — D696 Thrombocytopenia, unspecified: Secondary | ICD-10-CM

## 2023-04-23 DIAGNOSIS — Z720 Tobacco use: Secondary | ICD-10-CM

## 2023-04-23 DIAGNOSIS — E559 Vitamin D deficiency, unspecified: Secondary | ICD-10-CM | POA: Diagnosis not present

## 2023-04-23 DIAGNOSIS — E7800 Pure hypercholesterolemia, unspecified: Secondary | ICD-10-CM | POA: Diagnosis not present

## 2023-04-23 LAB — LIPID PANEL
Cholesterol: 136 mg/dL (ref 0–200)
HDL: 36.2 mg/dL — ABNORMAL LOW (ref >39.00)
LDL Cholesterol: 83 mg/dL (ref 0–99)
NonHDL: 99.49
Total CHOL/HDL Ratio: 4
Triglycerides: 80 mg/dL (ref 0.0–149.0)
VLDL: 16 mg/dL (ref 0.0–40.0)

## 2023-04-23 LAB — TSH: TSH: 1.71 u[IU]/mL (ref 0.35–5.50)

## 2023-04-23 LAB — CBC WITH DIFFERENTIAL/PLATELET
Basophils Absolute: 0 10*3/uL (ref 0.0–0.1)
Basophils Relative: 1 % (ref 0.0–3.0)
Eosinophils Absolute: 0.2 10*3/uL (ref 0.0–0.7)
Eosinophils Relative: 4.1 % (ref 0.0–5.0)
HCT: 44.3 % (ref 39.0–52.0)
Hemoglobin: 14.6 g/dL (ref 13.0–17.0)
Lymphocytes Relative: 42.4 % (ref 12.0–46.0)
Lymphs Abs: 1.7 10*3/uL (ref 0.7–4.0)
MCHC: 32.9 g/dL (ref 30.0–36.0)
MCV: 98 fL (ref 78.0–100.0)
Monocytes Absolute: 0.3 10*3/uL (ref 0.1–1.0)
Monocytes Relative: 6.8 % (ref 3.0–12.0)
Neutro Abs: 1.8 10*3/uL (ref 1.4–7.7)
Neutrophils Relative %: 45.7 % (ref 43.0–77.0)
Platelets: 157 10*3/uL (ref 150.0–400.0)
RBC: 4.51 Mil/uL (ref 4.22–5.81)
RDW: 14.7 % (ref 11.5–15.5)
WBC: 3.9 10*3/uL — ABNORMAL LOW (ref 4.0–10.5)

## 2023-04-23 LAB — HEPATIC FUNCTION PANEL
ALT: 7 U/L (ref 0–53)
AST: 15 U/L (ref 0–37)
Albumin: 4.1 g/dL (ref 3.5–5.2)
Alkaline Phosphatase: 44 U/L (ref 39–117)
Bilirubin, Direct: 0.1 mg/dL (ref 0.0–0.3)
Total Bilirubin: 0.5 mg/dL (ref 0.2–1.2)
Total Protein: 6.3 g/dL (ref 6.0–8.3)

## 2023-04-23 LAB — BASIC METABOLIC PANEL
BUN: 12 mg/dL (ref 6–23)
CO2: 28 meq/L (ref 19–32)
Calcium: 9.4 mg/dL (ref 8.4–10.5)
Chloride: 105 meq/L (ref 96–112)
Creatinine, Ser: 1.29 mg/dL (ref 0.40–1.50)
GFR: 52.04 mL/min — ABNORMAL LOW (ref >60.00)
Glucose, Bld: 80 mg/dL (ref 70–99)
Potassium: 4.3 meq/L (ref 3.5–5.1)
Sodium: 141 meq/L (ref 135–145)

## 2023-04-23 NOTE — Assessment & Plan Note (Signed)
Last platelet count 146 - relatively stable.  Follow.  Recheck cbc with next labs.

## 2023-04-23 NOTE — Assessment & Plan Note (Signed)
Discussed the need to quit smoking.  Have discussed obtaining screening CT chest.  He has declined. Continues to decline.  Has decreased amount smoking per day.

## 2023-04-23 NOTE — Assessment & Plan Note (Addendum)
Heart rate initially 48.  Recheck 50.  EKG - SB, with RBBB /bifascicular block. He is currently asymptomatic.  Given low pulse rate, discussed zio monitor.  Agreeable.

## 2023-04-23 NOTE — Assessment & Plan Note (Signed)
Last vitamin D level 01/2023 - wnl.  Follow.

## 2023-04-23 NOTE — Assessment & Plan Note (Signed)
Chronic and stable Recheck metabolic panel Avoid nephrotoxic meds  

## 2023-04-23 NOTE — Assessment & Plan Note (Signed)
Continue Crestor.  Low-cholesterol diet and exercise.  Follow lipid panel liver function tests. 

## 2023-04-24 ENCOUNTER — Ambulatory Visit: Payer: Medicare Other | Attending: Internal Medicine

## 2023-04-24 DIAGNOSIS — R001 Bradycardia, unspecified: Secondary | ICD-10-CM

## 2023-05-01 DIAGNOSIS — R001 Bradycardia, unspecified: Secondary | ICD-10-CM

## 2023-05-17 DIAGNOSIS — R001 Bradycardia, unspecified: Secondary | ICD-10-CM | POA: Diagnosis not present

## 2023-05-31 ENCOUNTER — Telehealth: Payer: Self-pay

## 2023-05-31 DIAGNOSIS — I495 Sick sinus syndrome: Secondary | ICD-10-CM

## 2023-05-31 NOTE — Telephone Encounter (Signed)
Patient states he would like to know his heart monitor results.  Patient states he would like for [place] to please call him.

## 2023-05-31 NOTE — Telephone Encounter (Signed)
FYI. Looks like he had a zio placed in October.

## 2023-06-01 NOTE — Telephone Encounter (Signed)
Have tried to call him previously and again 05/31/23.  Left message.  Monitor - does not reveal any sustained rhythm issues.  I wanted to talk with him more about f/u.  See if you can get a number or time he would be available to talk.

## 2023-06-01 NOTE — Telephone Encounter (Signed)
Discussed results with Mr Slocumb.  Concern regarding possible issues with tachy/brady syndrome.  Agreeable to cardiology referral.  Order placed for referral.

## 2023-06-21 ENCOUNTER — Ambulatory Visit: Payer: Medicare Other | Attending: Cardiology | Admitting: Cardiology

## 2023-06-21 ENCOUNTER — Encounter: Payer: Self-pay | Admitting: Cardiology

## 2023-06-21 VITALS — BP 150/90 | HR 48 | Ht 72.0 in | Wt 181.8 lb

## 2023-06-21 DIAGNOSIS — I4729 Other ventricular tachycardia: Secondary | ICD-10-CM | POA: Diagnosis not present

## 2023-06-21 DIAGNOSIS — I452 Bifascicular block: Secondary | ICD-10-CM | POA: Diagnosis not present

## 2023-06-21 DIAGNOSIS — R001 Bradycardia, unspecified: Secondary | ICD-10-CM | POA: Diagnosis not present

## 2023-06-21 DIAGNOSIS — R030 Elevated blood-pressure reading, without diagnosis of hypertension: Secondary | ICD-10-CM
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
## 2023-06-21 NOTE — Patient Instructions (Signed)
Medication Instructions:  - Your physician recommends that you continue on your current medications as directed. Please refer to the Current Medication list given to you today.  *If you need a refill on your cardiac medications before your next appointment, please call your pharmacy*   Lab Work: - none ordered  If you have labs (blood work) drawn today and your tests are completely normal, you will receive your results only by: MyChart Message (if you have MyChart) OR A paper copy in the mail If you have any lab test that is abnormal or we need to change your treatment, we will call you to review the results.   Testing/Procedures:  1) Echocardiogram: - Your physician has requested that you have an echocardiogram. Echocardiography is a painless test that uses sound waves to create images of your heart. It provides your doctor with information about the size and shape of your heart and how well your heart's chambers and valves are working. This procedure takes approximately one hour. There are no restrictions for this procedure. There is a possibility that an IV may need to be started during your test to inject an image enhancing agent. This is done to obtain more optimal pictures of your heart. Therefore we ask that you do at least drink some water prior to coming in to hydrate your veins.   Please do NOT wear cologne, perfume, aftershave, or lotions (deodorant is allowed). Please arrive 15 minutes prior to your appointment time.  Please note: We ask at that you not bring children with you during ultrasound (echo/ vascular) testing. Due to room size and safety concerns, children are not allowed in the ultrasound rooms during exams. Our front office staff cannot provide observation of children in our lobby area while testing is being conducted. An adult accompanying a patient to their appointment will only be allowed in the ultrasound room at the discretion of the ultrasound technician under  special circumstances. We apologize for any inconvenience.    Follow-Up: At [facility], you and your health needs are our priority.  As part of our continuing mission to provide you with exceptional heart care, we have created designated Provider Care Teams.  These Care Teams include your primary Cardiologist (physician) and Advanced Practice Providers (APPs -  Physician Assistants and Nurse Practitioners) who all work together to provide you with the care you need, when you need it.  We recommend signing up for the patient portal called "MyChart".  Sign up information is provided on this After Visit Summary.  MyChart is used to connect with patients for Virtual Visits (Telemedicine).  Patients are able to view lab/test results, encounter notes, upcoming appointments, etc.  Non-urgent messages can be sent to your provider as well.   To learn more about what you can do with MyChart, go to [email or web address].    Your next appointment:   6-8 week(s)  Provider:   Bryan Lemma, MD  Other Instructions  Echocardiogram An echocardiogram is a test that uses sound waves to make images of your heart. This way of making images is often called ultrasound. The images from this test can help find out many things about your heart, including: The size and shape of your heart. The strength of your heart muscle and how well it's working. The size, thickness, and movement of your heart's walls. How your heart valves are working. Problems such as: A tumor or a growth from an infection around the heart valves. Areas of heart muscle that aren't  working well because of poor blood flow or injury from a heart attack. An aneurysm. This is a weak or damaged part of an artery wall. An artery is a blood vessel. Tell a health care provider about: Any allergies you have. All medicines you're taking, including vitamins, herbs, eye drops, creams, and over-the-counter medicines. Any bleeding problems  you have. Any surgeries you've had. Any medical problems you have. Whether you're pregnant or may be pregnant. What are the risks? Your health care provider will talk with you about risks. These may include an allergic reaction to IV dye that may be used during the test. What happens before the test? You don't need to do anything to get ready for this test. You may eat and drink normally. What happens during the test?  You'll take off your clothes from the waist up and put on a hospital gown. Sticky patches called electrodes may be placed on your chest. These will be connected to a machine that monitors your heart rate and rhythm. You'll lie down on a table for the exam. A wand covered in gel will be moved over your chest. Sound waves from the wand will go to your heart and bounce back--or "echo" back. The sound waves will go to a computer that uses them to make images of your heart. The images can be viewed on a monitor. The images will also be recorded on the computer so your provider can look at them later. You may be asked to change positions or hold your breath for a short time. This makes it easier to get different views or better views of your heart. In some cases, you may be given a dye through an IV. The IV is put into one of your veins. This dye can make the areas of your heart easier to see. The procedure may vary among providers and hospitals. What can I expect after the test? You may return to your normal diet, activities, and medicines unless your provider tells you not to. If an IV was placed for the test, it will be removed. It's up to you to get the results of your test. Ask your provider, or the department that's doing the test, when your results will be ready. This information is not intended to replace advice given to you by your health care provider. Make sure you discuss any questions you have with your health care provider. Document Revised: 09/01/2022 Document Reviewed:  09/01/2022 Elsevier Patient Education  2024 [employer].

## 2023-07-24 ENCOUNTER — Ambulatory Visit: Payer: Medicare Other | Attending: Cardiology

## 2023-07-24 DIAGNOSIS — I452 Bifascicular block: Secondary | ICD-10-CM

## 2023-07-24 DIAGNOSIS — R001 Bradycardia, unspecified: Secondary | ICD-10-CM

## 2023-07-24 DIAGNOSIS — I4729 Other ventricular tachycardia: Secondary | ICD-10-CM

## 2023-07-24 LAB — ECHOCARDIOGRAM COMPLETE
AV Mean grad: 3 mm[Hg]
AV Peak grad: 6.3 mm[Hg]
Ao pk vel: 1.25 m/s
Area-P 1/2: 2.32 cm2
S' Lateral: 3.3 cm

## 2023-07-31 ENCOUNTER — Encounter: Payer: Self-pay | Admitting: Emergency Medicine

## 2023-08-02 ENCOUNTER — Ambulatory Visit: Payer: Medicare Other | Attending: Nurse Practitioner | Admitting: Nurse Practitioner

## 2023-08-02 ENCOUNTER — Encounter: Payer: Self-pay | Admitting: Nurse Practitioner

## 2023-08-02 VITALS — BP 125/72 | HR 47 | Ht 72.0 in | Wt 182.0 lb

## 2023-08-02 DIAGNOSIS — G4730 Sleep apnea, unspecified: Secondary | ICD-10-CM | POA: Diagnosis not present

## 2023-08-02 DIAGNOSIS — N1831 Chronic kidney disease, stage 3a: Secondary | ICD-10-CM | POA: Diagnosis not present

## 2023-08-02 DIAGNOSIS — I272 Pulmonary hypertension, unspecified: Secondary | ICD-10-CM

## 2023-08-02 DIAGNOSIS — R001 Bradycardia, unspecified: Secondary | ICD-10-CM

## 2023-08-02 DIAGNOSIS — I452 Bifascicular block: Secondary | ICD-10-CM | POA: Diagnosis not present

## 2023-08-02 DIAGNOSIS — I471 Supraventricular tachycardia, unspecified: Secondary | ICD-10-CM

## 2023-08-02 NOTE — Patient Instructions (Signed)
Medication Instructions:  Your Physician recommend you continue on your current medication as directed.    *If you need a refill on your cardiac medications before your next appointment, please call your pharmacy*   Lab Work: None ordered at this time  If you have labs (blood work) drawn today and your tests are completely normal, you will receive your results only by: MyChart Message (if you have MyChart) OR A paper copy in the mail If you have any lab test that is abnormal or we need to change your treatment, we will call you to review the results.   Follow-Up: At [facility], you and your health needs are our priority.  As part of our continuing mission to provide you with exceptional heart care, we have created designated Provider Care Teams.  These Care Teams include your primary Cardiologist (physician) and Advanced Practice Providers (APPs -  Physician Assistants and Nurse Practitioners) who all work together to provide you with the care you need, when you need it.  We recommend signing up for the patient portal called "MyChart".  Sign up information is provided on this After Visit Summary.  MyChart is used to connect with patients for Virtual Visits (Telemedicine).  Patients are able to view lab/test results, encounter notes, upcoming appointments, etc.  Non-urgent messages can be sent to your provider as well.   To learn more about what you can do with MyChart, go to [email or web address].    Your next appointment:   6 month(s)  Provider:   You may see Bryan Lemma, MD or one of the following Advanced Practice Providers on your designated Care Team:   Nicolasa Ducking, NP
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
## 2023-08-27 ENCOUNTER — Ambulatory Visit (INDEPENDENT_AMBULATORY_CARE_PROVIDER_SITE_OTHER): Payer: Medicare Other | Admitting: *Deleted

## 2023-08-27 VITALS — Ht 71.75 in | Wt 175.0 lb

## 2023-08-27 DIAGNOSIS — Z0000 Encounter for general adult medical examination without abnormal findings: Secondary | ICD-10-CM | POA: Diagnosis not present

## 2023-08-27 NOTE — Patient Instructions (Signed)
 Michael Wiley , Thank you for taking time to come for your Medicare Wellness Visit. I appreciate your ongoing commitment to your health goals. Please review the following plan we discussed and let me know if I can assist you in the future.   Referrals/Orders/Follow-Ups/Clinician Recommendations: Consider updating your shingles vaccines.  This is a list of the screening recommended for you and due dates:  Health Maintenance  Topic Date Due   Zoster (Shingles) Vaccine (1 of 2) Never done   COVID-19 Vaccine (4 - 2024-25 season) 03/18/2023   Medicare Annual Wellness Visit  08/26/2024   Colon Cancer Screening  06/22/2025   DTaP/Tdap/Td vaccine (2 - Td or Tdap) 12/16/2027   Pneumonia Vaccine  Completed   Flu Shot  Completed   HPV Vaccine  Aged Out   Hepatitis C Screening  Discontinued    Advanced directives: (Declined) Advance directive discussed with you today. Even though you declined this today, please call our office should you change your mind, and we can give you the proper paperwork for you to fill out.  Next Medicare Annual Wellness Visit scheduled for next year: Yes 08/27/2024
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
## 2023-10-30 DIAGNOSIS — T162XXA Foreign body in left ear, initial encounter: Secondary | ICD-10-CM | POA: Diagnosis not present

## 2023-10-30 DIAGNOSIS — R051 Acute cough: Secondary | ICD-10-CM | POA: Diagnosis not present

## 2023-10-30 DIAGNOSIS — J40 Bronchitis, not specified as acute or chronic: Secondary | ICD-10-CM | POA: Diagnosis not present

## 2024-02-14 ENCOUNTER — Ambulatory Visit: Attending: Cardiology | Admitting: Cardiology

## 2024-02-14 NOTE — Progress Notes (Deleted)
 Cardiology Office Note:  .   Date:  02/14/2024  ID:  Michael Wiley, DOB [birth year], MRN [id number] PCP: Glendia Shad, MD  [facility] Providers Cardiologist:  Alm Clay, MD { Click to update primary MD,subspecialty MD or APP then REFRESH:1}    No chief complaint on file.   Patient Profile: .     Michael Wiley is a  83 y.o. male long-term smoker with no interest in stopping with a PMH notable for borderline HTN, HLD, CKD 3A with sinus bradycardia, bifascicular block and NSVT who presents here for 72-month follow-up at the request of Glendia Shad, MD.  Michael Wiley was originally seen in consultation for EVALUATION OF BRADYCARDIA (?  TACHYBRADYCARDIA syndrome), bifascicular block & to discuss results of Zio Patch monitor in December 2020 for at the request of Glendia Shad, MD. he was recently noted to have a slow heart rate on the EKG was noted to fatigue/lack of energy.  Otherwise no syncope or near syncope.  No sensation of palpitations or exertional dyspnea/chest pain but otherwise no limitation to his activities including yard work and cleaning.  No syncope or near syncope. => We ordered a 2D echo and plan for close follow-up    Michael Wiley was last seen by Lonni Meager, NP on August 02, 2023.  He was overall doing well with no major symptoms.  Sleeping 8 hours a night.  Taking a nap during the day.  Very active in the mornings, always on the drill.  Usually tries to get nap at 2 PM.  Not interested in sleep study despite his wife that he snored.  No symptoms.  EKG showed heart rate of 47 bpm with bifascicular block.  Patient remained asymptomatic-no indication of chronotropic incompetence or acute need for PPM.  No sensation of brief PSVT runs.  STOP BANG score was 4 but he declined sleep evaluation.  Subjective  Discussed the use of AI scribe software for clinical note transcription with the patient, who gave verbal consent to proceed.  History of  Present Illness   Cardiovascular ROS: {roscv:310661}  ROS:  Review of Systems - {ros master:310782}    Objective    Studies Reviewed: SABRA        ECHO: Normal LV size and function.  EF 55 to 60%.  No RWMA.  G1 DD.  Suggestion of mildly elevated PAP (PASP estimated 45 mmHg with estimated RAP of 8 mmHg).  Mild MR.  Mild to moderate TR.  Normal aortic valve..  (07/24/2023)  8-day Zio patch: Predominant sinus rhythm heart rate range of 42-1 4012 bpm and average of 60 bpm.  2 runs of NSVT-10 beats; 15 atrial runs/SVT longest 11 beats.  Occasional PACs (1.4%).  Rare PVCs.  No A-fib or sustained arrhythmias.  (October 2024)  Risk Assessment/Calculations:     No BP recorded.  {Refresh Note OR Click here to enter BP  :1}***   STOP-Bang Score:  4  { Consider Dx Sleep Disordered Breathing or Sleep Apnea  ICD G47.33          :1}      Physical Exam:   VS:  There were no vitals taken for this visit.   Wt Readings from Last 3 Encounters:  08/27/23 175 lb (79.4 kg)  08/02/23 182 lb (82.6 kg)  06/21/23 181 lb 12.8 oz (82.5 kg)    GEN: Well nourished, well developed in no acute distress; healthy-appearing NECK: No JVD; No carotid bruits CARDIAC:  RRR; Normal  S1, split S2; no murmurs, rubs, gallops RESPIRATORY:  Clear to auscultation without rales, wheezing or rhonchi ; nonlabored, good air movement. ABDOMEN: Soft, non-tender, non-distended EXTREMITIES:  No edema; No deformity      ASSESSMENT AND PLAN: .    Problem List Items Addressed This Visit       Cardiology Problems   Bifascicular bundle branch block (Chronic)   Hypercholesteremia (Chronic)   NSVT (nonsustained ventricular tachycardia) (HCC) - Primary     Other   Bradycardia (Chronic)    Assessment and Plan Assessment & Plan        {Are you ordering a CV Procedure (e.g. stress test, cath, DCCV, TEE, etc)?   Press F2        :[id number]}   Follow-Up: No follow-ups on file.  Total time spent: *** min spent with patient +  *** min spent charting = *** min    Signed, Alm MICAEL Clay, MD, MS Alm Clay, M.D., M.S. Interventional Chartered certified accountant  Pager # [phone]

## 2024-02-21 ENCOUNTER — Ambulatory Visit: Admitting: Nurse Practitioner
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
## 2024-06-10 ENCOUNTER — Telehealth: Payer: Self-pay | Admitting: Internal Medicine

## 2024-06-10 NOTE — Telephone Encounter (Signed)
 Lm to call office to schedule a CPE in Jan or Feb 2026.  E2C2 please schedule appt.

## 2024-07-07 ENCOUNTER — Ambulatory Visit (INDEPENDENT_AMBULATORY_CARE_PROVIDER_SITE_OTHER)

## 2024-07-07 DIAGNOSIS — Z23 Encounter for immunization: Secondary | ICD-10-CM | POA: Diagnosis not present

## 2024-07-07 NOTE — Progress Notes (Cosign Needed)
 Pt presented for their vitamin FLU injection. Pt was identified through two identifiers. Pt tolerated shot well in their left deltoid

## 2024-08-27 ENCOUNTER — Ambulatory Visit: Payer: Medicare Other

## 2024-08-28 ENCOUNTER — Encounter: Admitting: Internal Medicine
